# Patient Record
Sex: Male | Born: 1945 | Race: White | Hispanic: No | Marital: Married | Smoking: Current every day smoker
Health system: Southern US, Community
[De-identification: ages and names within clinical notes are randomized; demographics above are authoritative.]

## PROBLEM LIST (undated history)

## (undated) DIAGNOSIS — I10 Essential (primary) hypertension: Secondary | ICD-10-CM

## (undated) DIAGNOSIS — F419 Anxiety disorder, unspecified: Secondary | ICD-10-CM

## (undated) DIAGNOSIS — I639 Cerebral infarction, unspecified: Secondary | ICD-10-CM

## (undated) DIAGNOSIS — E039 Hypothyroidism, unspecified: Secondary | ICD-10-CM

## (undated) DIAGNOSIS — N200 Calculus of kidney: Secondary | ICD-10-CM

## (undated) DIAGNOSIS — Z720 Tobacco use: Secondary | ICD-10-CM

## (undated) DIAGNOSIS — G43909 Migraine, unspecified, not intractable, without status migrainosus: Secondary | ICD-10-CM

## (undated) DIAGNOSIS — F0390 Unspecified dementia without behavioral disturbance: Secondary | ICD-10-CM

## (undated) DIAGNOSIS — J449 Chronic obstructive pulmonary disease, unspecified: Secondary | ICD-10-CM

---
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
## 2004-05-25 ENCOUNTER — Ambulatory Visit: Payer: Self-pay | Admitting: Family Medicine

## 2014-12-16 DIAGNOSIS — R0600 Dyspnea, unspecified: Secondary | ICD-10-CM | POA: Diagnosis not present

## 2014-12-16 DIAGNOSIS — J449 Chronic obstructive pulmonary disease, unspecified: Secondary | ICD-10-CM | POA: Diagnosis not present

## 2015-01-16 DIAGNOSIS — R0600 Dyspnea, unspecified: Secondary | ICD-10-CM | POA: Diagnosis not present

## 2015-01-16 DIAGNOSIS — J449 Chronic obstructive pulmonary disease, unspecified: Secondary | ICD-10-CM | POA: Diagnosis not present

## 2015-02-16 DIAGNOSIS — J449 Chronic obstructive pulmonary disease, unspecified: Secondary | ICD-10-CM | POA: Diagnosis not present

## 2015-02-16 DIAGNOSIS — R0600 Dyspnea, unspecified: Secondary | ICD-10-CM | POA: Diagnosis not present

## 2015-02-23 DIAGNOSIS — Z0000 Encounter for general adult medical examination without abnormal findings: Secondary | ICD-10-CM | POA: Diagnosis not present

## 2015-02-23 DIAGNOSIS — Z1389 Encounter for screening for other disorder: Secondary | ICD-10-CM | POA: Diagnosis not present

## 2015-02-23 DIAGNOSIS — Z139 Encounter for screening, unspecified: Secondary | ICD-10-CM | POA: Diagnosis not present

## 2015-02-23 DIAGNOSIS — F17200 Nicotine dependence, unspecified, uncomplicated: Secondary | ICD-10-CM | POA: Diagnosis not present

## 2015-03-16 DIAGNOSIS — R32 Unspecified urinary incontinence: Secondary | ICD-10-CM | POA: Diagnosis not present

## 2015-03-16 DIAGNOSIS — N390 Urinary tract infection, site not specified: Secondary | ICD-10-CM | POA: Diagnosis not present

## 2015-03-16 DIAGNOSIS — Z125 Encounter for screening for malignant neoplasm of prostate: Secondary | ICD-10-CM | POA: Diagnosis not present

## 2015-03-16 DIAGNOSIS — E039 Hypothyroidism, unspecified: Secondary | ICD-10-CM | POA: Diagnosis not present

## 2015-03-18 DIAGNOSIS — J449 Chronic obstructive pulmonary disease, unspecified: Secondary | ICD-10-CM | POA: Diagnosis not present

## 2015-03-18 DIAGNOSIS — R0600 Dyspnea, unspecified: Secondary | ICD-10-CM | POA: Diagnosis not present

## 2015-03-27 DIAGNOSIS — I714 Abdominal aortic aneurysm, without rupture: Secondary | ICD-10-CM | POA: Diagnosis not present

## 2015-03-27 DIAGNOSIS — Z136 Encounter for screening for cardiovascular disorders: Secondary | ICD-10-CM | POA: Diagnosis not present

## 2015-03-28 DIAGNOSIS — Z23 Encounter for immunization: Secondary | ICD-10-CM | POA: Diagnosis not present

## 2015-03-28 DIAGNOSIS — Z6831 Body mass index (BMI) 31.0-31.9, adult: Secondary | ICD-10-CM | POA: Diagnosis not present

## 2015-03-28 DIAGNOSIS — R3129 Other microscopic hematuria: Secondary | ICD-10-CM | POA: Diagnosis not present

## 2015-03-28 DIAGNOSIS — E039 Hypothyroidism, unspecified: Secondary | ICD-10-CM | POA: Diagnosis not present

## 2015-04-13 DIAGNOSIS — N401 Enlarged prostate with lower urinary tract symptoms: Secondary | ICD-10-CM | POA: Diagnosis not present

## 2015-04-13 DIAGNOSIS — R3129 Other microscopic hematuria: Secondary | ICD-10-CM | POA: Diagnosis not present

## 2015-04-13 DIAGNOSIS — R351 Nocturia: Secondary | ICD-10-CM | POA: Diagnosis not present

## 2015-04-18 DIAGNOSIS — R0600 Dyspnea, unspecified: Secondary | ICD-10-CM | POA: Diagnosis not present

## 2015-04-18 DIAGNOSIS — J449 Chronic obstructive pulmonary disease, unspecified: Secondary | ICD-10-CM | POA: Diagnosis not present

## 2015-04-27 DIAGNOSIS — E782 Mixed hyperlipidemia: Secondary | ICD-10-CM | POA: Diagnosis not present

## 2015-04-27 DIAGNOSIS — E039 Hypothyroidism, unspecified: Secondary | ICD-10-CM | POA: Diagnosis not present

## 2015-04-27 DIAGNOSIS — R7301 Impaired fasting glucose: Secondary | ICD-10-CM | POA: Diagnosis not present

## 2015-05-01 DIAGNOSIS — N401 Enlarged prostate with lower urinary tract symptoms: Secondary | ICD-10-CM | POA: Diagnosis not present

## 2015-05-01 DIAGNOSIS — R3129 Other microscopic hematuria: Secondary | ICD-10-CM | POA: Diagnosis not present

## 2015-05-03 DIAGNOSIS — Z720 Tobacco use: Secondary | ICD-10-CM | POA: Diagnosis not present

## 2015-05-03 DIAGNOSIS — J019 Acute sinusitis, unspecified: Secondary | ICD-10-CM | POA: Diagnosis not present

## 2015-05-03 DIAGNOSIS — R6883 Chills (without fever): Secondary | ICD-10-CM | POA: Diagnosis not present

## 2015-05-03 DIAGNOSIS — Z6831 Body mass index (BMI) 31.0-31.9, adult: Secondary | ICD-10-CM | POA: Diagnosis not present

## 2015-05-17 DIAGNOSIS — E782 Mixed hyperlipidemia: Secondary | ICD-10-CM | POA: Diagnosis not present

## 2015-05-17 DIAGNOSIS — R7303 Prediabetes: Secondary | ICD-10-CM | POA: Diagnosis not present

## 2015-05-17 DIAGNOSIS — Z720 Tobacco use: Secondary | ICD-10-CM | POA: Diagnosis not present

## 2015-05-17 DIAGNOSIS — J449 Chronic obstructive pulmonary disease, unspecified: Secondary | ICD-10-CM | POA: Diagnosis not present

## 2015-05-18 DIAGNOSIS — R0600 Dyspnea, unspecified: Secondary | ICD-10-CM | POA: Diagnosis not present

## 2015-05-18 DIAGNOSIS — J449 Chronic obstructive pulmonary disease, unspecified: Secondary | ICD-10-CM | POA: Diagnosis not present

## 2015-05-22 DIAGNOSIS — N200 Calculus of kidney: Secondary | ICD-10-CM | POA: Diagnosis not present

## 2015-05-22 DIAGNOSIS — I714 Abdominal aortic aneurysm, without rupture: Secondary | ICD-10-CM | POA: Diagnosis not present

## 2015-05-22 DIAGNOSIS — N281 Cyst of kidney, acquired: Secondary | ICD-10-CM | POA: Diagnosis not present

## 2015-05-22 DIAGNOSIS — R310 Gross hematuria: Secondary | ICD-10-CM | POA: Diagnosis not present

## 2015-05-30 DIAGNOSIS — N200 Calculus of kidney: Secondary | ICD-10-CM | POA: Diagnosis not present

## 2015-05-30 DIAGNOSIS — N281 Cyst of kidney, acquired: Secondary | ICD-10-CM | POA: Diagnosis not present

## 2015-05-30 DIAGNOSIS — N401 Enlarged prostate with lower urinary tract symptoms: Secondary | ICD-10-CM | POA: Diagnosis not present

## 2015-05-31 DIAGNOSIS — F329 Major depressive disorder, single episode, unspecified: Secondary | ICD-10-CM | POA: Diagnosis not present

## 2015-05-31 DIAGNOSIS — Z0000 Encounter for general adult medical examination without abnormal findings: Secondary | ICD-10-CM | POA: Diagnosis not present

## 2015-05-31 DIAGNOSIS — Z139 Encounter for screening, unspecified: Secondary | ICD-10-CM | POA: Diagnosis not present

## 2015-06-01 DIAGNOSIS — Z6831 Body mass index (BMI) 31.0-31.9, adult: Secondary | ICD-10-CM | POA: Diagnosis not present

## 2015-06-01 DIAGNOSIS — J449 Chronic obstructive pulmonary disease, unspecified: Secondary | ICD-10-CM | POA: Diagnosis not present

## 2015-06-01 DIAGNOSIS — Z720 Tobacco use: Secondary | ICD-10-CM | POA: Diagnosis not present

## 2015-06-18 DIAGNOSIS — R0600 Dyspnea, unspecified: Secondary | ICD-10-CM | POA: Diagnosis not present

## 2015-06-18 DIAGNOSIS — J449 Chronic obstructive pulmonary disease, unspecified: Secondary | ICD-10-CM | POA: Diagnosis not present

## 2015-07-19 DIAGNOSIS — R0600 Dyspnea, unspecified: Secondary | ICD-10-CM | POA: Diagnosis not present

## 2015-07-19 DIAGNOSIS — J449 Chronic obstructive pulmonary disease, unspecified: Secondary | ICD-10-CM | POA: Diagnosis not present

## 2015-08-16 DIAGNOSIS — J449 Chronic obstructive pulmonary disease, unspecified: Secondary | ICD-10-CM | POA: Diagnosis not present

## 2015-08-16 DIAGNOSIS — R0600 Dyspnea, unspecified: Secondary | ICD-10-CM | POA: Diagnosis not present

## 2015-08-31 DIAGNOSIS — R7301 Impaired fasting glucose: Secondary | ICD-10-CM | POA: Diagnosis not present

## 2015-08-31 DIAGNOSIS — E782 Mixed hyperlipidemia: Secondary | ICD-10-CM | POA: Diagnosis not present

## 2015-09-13 DIAGNOSIS — R7303 Prediabetes: Secondary | ICD-10-CM | POA: Diagnosis not present

## 2015-09-13 DIAGNOSIS — J449 Chronic obstructive pulmonary disease, unspecified: Secondary | ICD-10-CM | POA: Diagnosis not present

## 2015-09-13 DIAGNOSIS — Z720 Tobacco use: Secondary | ICD-10-CM | POA: Diagnosis not present

## 2015-09-13 DIAGNOSIS — E782 Mixed hyperlipidemia: Secondary | ICD-10-CM | POA: Diagnosis not present

## 2015-09-16 DIAGNOSIS — J449 Chronic obstructive pulmonary disease, unspecified: Secondary | ICD-10-CM | POA: Diagnosis not present

## 2015-09-16 DIAGNOSIS — R0600 Dyspnea, unspecified: Secondary | ICD-10-CM | POA: Diagnosis not present

## 2015-10-16 DIAGNOSIS — J449 Chronic obstructive pulmonary disease, unspecified: Secondary | ICD-10-CM | POA: Diagnosis not present

## 2015-10-16 DIAGNOSIS — R0600 Dyspnea, unspecified: Secondary | ICD-10-CM | POA: Diagnosis not present

## 2015-10-18 DIAGNOSIS — Z6831 Body mass index (BMI) 31.0-31.9, adult: Secondary | ICD-10-CM | POA: Diagnosis not present

## 2015-10-18 DIAGNOSIS — J449 Chronic obstructive pulmonary disease, unspecified: Secondary | ICD-10-CM | POA: Diagnosis not present

## 2015-10-18 DIAGNOSIS — Z720 Tobacco use: Secondary | ICD-10-CM | POA: Diagnosis not present

## 2015-11-16 DIAGNOSIS — R0600 Dyspnea, unspecified: Secondary | ICD-10-CM | POA: Diagnosis not present

## 2015-11-16 DIAGNOSIS — J449 Chronic obstructive pulmonary disease, unspecified: Secondary | ICD-10-CM | POA: Diagnosis not present

## 2015-12-16 DIAGNOSIS — R0600 Dyspnea, unspecified: Secondary | ICD-10-CM | POA: Diagnosis not present

## 2015-12-16 DIAGNOSIS — J449 Chronic obstructive pulmonary disease, unspecified: Secondary | ICD-10-CM | POA: Diagnosis not present

## 2015-12-28 DIAGNOSIS — E782 Mixed hyperlipidemia: Secondary | ICD-10-CM | POA: Diagnosis not present

## 2015-12-28 DIAGNOSIS — R7303 Prediabetes: Secondary | ICD-10-CM | POA: Diagnosis not present

## 2015-12-28 DIAGNOSIS — F17200 Nicotine dependence, unspecified, uncomplicated: Secondary | ICD-10-CM | POA: Diagnosis not present

## 2015-12-28 DIAGNOSIS — E039 Hypothyroidism, unspecified: Secondary | ICD-10-CM | POA: Diagnosis not present

## 2015-12-28 DIAGNOSIS — Z1389 Encounter for screening for other disorder: Secondary | ICD-10-CM | POA: Diagnosis not present

## 2016-01-16 DIAGNOSIS — R0600 Dyspnea, unspecified: Secondary | ICD-10-CM | POA: Diagnosis not present

## 2016-01-16 DIAGNOSIS — J449 Chronic obstructive pulmonary disease, unspecified: Secondary | ICD-10-CM | POA: Diagnosis not present

## 2016-02-16 DIAGNOSIS — J449 Chronic obstructive pulmonary disease, unspecified: Secondary | ICD-10-CM | POA: Diagnosis not present

## 2016-02-16 DIAGNOSIS — R0600 Dyspnea, unspecified: Secondary | ICD-10-CM | POA: Diagnosis not present

## 2016-03-06 DIAGNOSIS — Z0000 Encounter for general adult medical examination without abnormal findings: Secondary | ICD-10-CM | POA: Diagnosis not present

## 2016-03-06 DIAGNOSIS — Z139 Encounter for screening, unspecified: Secondary | ICD-10-CM | POA: Diagnosis not present

## 2016-03-06 DIAGNOSIS — Z1389 Encounter for screening for other disorder: Secondary | ICD-10-CM | POA: Diagnosis not present

## 2016-03-12 DIAGNOSIS — J449 Chronic obstructive pulmonary disease, unspecified: Secondary | ICD-10-CM | POA: Diagnosis not present

## 2016-03-12 DIAGNOSIS — J0100 Acute maxillary sinusitis, unspecified: Secondary | ICD-10-CM | POA: Diagnosis not present

## 2016-03-17 DIAGNOSIS — J449 Chronic obstructive pulmonary disease, unspecified: Secondary | ICD-10-CM | POA: Diagnosis not present

## 2016-03-17 DIAGNOSIS — R0600 Dyspnea, unspecified: Secondary | ICD-10-CM | POA: Diagnosis not present

## 2016-04-17 DIAGNOSIS — J449 Chronic obstructive pulmonary disease, unspecified: Secondary | ICD-10-CM | POA: Diagnosis not present

## 2016-04-17 DIAGNOSIS — R0600 Dyspnea, unspecified: Secondary | ICD-10-CM | POA: Diagnosis not present

## 2016-05-17 DIAGNOSIS — R0600 Dyspnea, unspecified: Secondary | ICD-10-CM | POA: Diagnosis not present

## 2016-05-17 DIAGNOSIS — J449 Chronic obstructive pulmonary disease, unspecified: Secondary | ICD-10-CM | POA: Diagnosis not present

## 2016-06-17 DIAGNOSIS — J449 Chronic obstructive pulmonary disease, unspecified: Secondary | ICD-10-CM | POA: Diagnosis not present

## 2016-06-17 DIAGNOSIS — R0600 Dyspnea, unspecified: Secondary | ICD-10-CM | POA: Diagnosis not present

## 2016-07-18 DIAGNOSIS — J449 Chronic obstructive pulmonary disease, unspecified: Secondary | ICD-10-CM | POA: Diagnosis not present

## 2016-07-18 DIAGNOSIS — R0600 Dyspnea, unspecified: Secondary | ICD-10-CM | POA: Diagnosis not present

## 2016-07-20 DIAGNOSIS — J069 Acute upper respiratory infection, unspecified: Secondary | ICD-10-CM | POA: Diagnosis not present

## 2016-07-20 DIAGNOSIS — R05 Cough: Secondary | ICD-10-CM | POA: Diagnosis not present

## 2016-08-15 DIAGNOSIS — Z720 Tobacco use: Secondary | ICD-10-CM | POA: Diagnosis not present

## 2016-08-15 DIAGNOSIS — R319 Hematuria, unspecified: Secondary | ICD-10-CM | POA: Diagnosis not present

## 2016-08-15 DIAGNOSIS — E782 Mixed hyperlipidemia: Secondary | ICD-10-CM | POA: Diagnosis not present

## 2016-08-15 DIAGNOSIS — R0600 Dyspnea, unspecified: Secondary | ICD-10-CM | POA: Diagnosis not present

## 2016-08-15 DIAGNOSIS — D126 Benign neoplasm of colon, unspecified: Secondary | ICD-10-CM | POA: Diagnosis not present

## 2016-08-15 DIAGNOSIS — E039 Hypothyroidism, unspecified: Secondary | ICD-10-CM | POA: Diagnosis not present

## 2016-08-15 DIAGNOSIS — J449 Chronic obstructive pulmonary disease, unspecified: Secondary | ICD-10-CM | POA: Diagnosis not present

## 2016-08-22 DIAGNOSIS — R319 Hematuria, unspecified: Secondary | ICD-10-CM | POA: Diagnosis not present

## 2016-08-22 DIAGNOSIS — Z87891 Personal history of nicotine dependence: Secondary | ICD-10-CM | POA: Diagnosis not present

## 2016-08-22 DIAGNOSIS — Z6829 Body mass index (BMI) 29.0-29.9, adult: Secondary | ICD-10-CM | POA: Diagnosis not present

## 2016-08-22 DIAGNOSIS — J449 Chronic obstructive pulmonary disease, unspecified: Secondary | ICD-10-CM | POA: Diagnosis not present

## 2016-08-28 DIAGNOSIS — F330 Major depressive disorder, recurrent, mild: Secondary | ICD-10-CM | POA: Diagnosis not present

## 2016-08-28 DIAGNOSIS — E039 Hypothyroidism, unspecified: Secondary | ICD-10-CM | POA: Diagnosis not present

## 2016-08-28 DIAGNOSIS — N400 Enlarged prostate without lower urinary tract symptoms: Secondary | ICD-10-CM | POA: Diagnosis not present

## 2016-08-28 DIAGNOSIS — E663 Overweight: Secondary | ICD-10-CM | POA: Diagnosis not present

## 2016-08-28 DIAGNOSIS — F17200 Nicotine dependence, unspecified, uncomplicated: Secondary | ICD-10-CM | POA: Diagnosis not present

## 2016-08-28 DIAGNOSIS — E785 Hyperlipidemia, unspecified: Secondary | ICD-10-CM | POA: Diagnosis not present

## 2016-08-28 DIAGNOSIS — N189 Chronic kidney disease, unspecified: Secondary | ICD-10-CM | POA: Diagnosis not present

## 2016-08-28 DIAGNOSIS — J449 Chronic obstructive pulmonary disease, unspecified: Secondary | ICD-10-CM | POA: Diagnosis not present

## 2016-08-28 DIAGNOSIS — K219 Gastro-esophageal reflux disease without esophagitis: Secondary | ICD-10-CM | POA: Diagnosis not present

## 2016-09-15 DIAGNOSIS — J449 Chronic obstructive pulmonary disease, unspecified: Secondary | ICD-10-CM | POA: Diagnosis not present

## 2016-09-15 DIAGNOSIS — R0600 Dyspnea, unspecified: Secondary | ICD-10-CM | POA: Diagnosis not present

## 2016-09-18 DIAGNOSIS — N401 Enlarged prostate with lower urinary tract symptoms: Secondary | ICD-10-CM | POA: Diagnosis not present

## 2016-09-18 DIAGNOSIS — N281 Cyst of kidney, acquired: Secondary | ICD-10-CM | POA: Diagnosis not present

## 2016-09-18 DIAGNOSIS — N200 Calculus of kidney: Secondary | ICD-10-CM | POA: Diagnosis not present

## 2016-09-18 DIAGNOSIS — R351 Nocturia: Secondary | ICD-10-CM | POA: Diagnosis not present

## 2016-10-02 DIAGNOSIS — N200 Calculus of kidney: Secondary | ICD-10-CM | POA: Diagnosis not present

## 2016-10-08 DIAGNOSIS — Z1211 Encounter for screening for malignant neoplasm of colon: Secondary | ICD-10-CM | POA: Diagnosis not present

## 2016-10-15 DIAGNOSIS — J449 Chronic obstructive pulmonary disease, unspecified: Secondary | ICD-10-CM | POA: Diagnosis not present

## 2016-10-15 DIAGNOSIS — R0600 Dyspnea, unspecified: Secondary | ICD-10-CM | POA: Diagnosis not present

## 2016-10-16 DIAGNOSIS — N401 Enlarged prostate with lower urinary tract symptoms: Secondary | ICD-10-CM | POA: Diagnosis not present

## 2016-10-16 DIAGNOSIS — N200 Calculus of kidney: Secondary | ICD-10-CM | POA: Diagnosis not present

## 2016-10-16 DIAGNOSIS — Z125 Encounter for screening for malignant neoplasm of prostate: Secondary | ICD-10-CM | POA: Diagnosis not present

## 2016-10-18 DIAGNOSIS — F17210 Nicotine dependence, cigarettes, uncomplicated: Secondary | ICD-10-CM | POA: Diagnosis not present

## 2016-10-18 DIAGNOSIS — J449 Chronic obstructive pulmonary disease, unspecified: Secondary | ICD-10-CM | POA: Diagnosis not present

## 2016-10-18 DIAGNOSIS — N400 Enlarged prostate without lower urinary tract symptoms: Secondary | ICD-10-CM | POA: Diagnosis not present

## 2016-10-18 DIAGNOSIS — N200 Calculus of kidney: Secondary | ICD-10-CM | POA: Diagnosis not present

## 2016-10-18 DIAGNOSIS — F329 Major depressive disorder, single episode, unspecified: Secondary | ICD-10-CM | POA: Diagnosis not present

## 2016-10-18 DIAGNOSIS — Z79899 Other long term (current) drug therapy: Secondary | ICD-10-CM | POA: Diagnosis not present

## 2016-10-25 DIAGNOSIS — N200 Calculus of kidney: Secondary | ICD-10-CM | POA: Diagnosis not present

## 2016-10-28 DIAGNOSIS — N200 Calculus of kidney: Secondary | ICD-10-CM | POA: Diagnosis not present

## 2016-10-28 DIAGNOSIS — R351 Nocturia: Secondary | ICD-10-CM | POA: Diagnosis not present

## 2016-10-28 DIAGNOSIS — N401 Enlarged prostate with lower urinary tract symptoms: Secondary | ICD-10-CM | POA: Diagnosis not present

## 2016-10-31 DIAGNOSIS — N201 Calculus of ureter: Secondary | ICD-10-CM | POA: Diagnosis not present

## 2016-10-31 DIAGNOSIS — R109 Unspecified abdominal pain: Secondary | ICD-10-CM | POA: Diagnosis not present

## 2016-11-11 DIAGNOSIS — J449 Chronic obstructive pulmonary disease, unspecified: Secondary | ICD-10-CM | POA: Diagnosis not present

## 2016-11-11 DIAGNOSIS — G4700 Insomnia, unspecified: Secondary | ICD-10-CM | POA: Diagnosis not present

## 2016-11-11 DIAGNOSIS — K219 Gastro-esophageal reflux disease without esophagitis: Secondary | ICD-10-CM | POA: Diagnosis not present

## 2016-11-11 DIAGNOSIS — F419 Anxiety disorder, unspecified: Secondary | ICD-10-CM | POA: Diagnosis not present

## 2016-11-11 DIAGNOSIS — K5730 Diverticulosis of large intestine without perforation or abscess without bleeding: Secondary | ICD-10-CM | POA: Diagnosis not present

## 2016-11-11 DIAGNOSIS — F329 Major depressive disorder, single episode, unspecified: Secondary | ICD-10-CM | POA: Diagnosis not present

## 2016-11-11 DIAGNOSIS — Z1211 Encounter for screening for malignant neoplasm of colon: Secondary | ICD-10-CM | POA: Diagnosis not present

## 2016-11-11 DIAGNOSIS — K648 Other hemorrhoids: Secondary | ICD-10-CM | POA: Diagnosis not present

## 2016-11-11 DIAGNOSIS — E7800 Pure hypercholesterolemia, unspecified: Secondary | ICD-10-CM | POA: Diagnosis not present

## 2016-11-15 DIAGNOSIS — R0600 Dyspnea, unspecified: Secondary | ICD-10-CM | POA: Diagnosis not present

## 2016-11-15 DIAGNOSIS — J449 Chronic obstructive pulmonary disease, unspecified: Secondary | ICD-10-CM | POA: Diagnosis not present

## 2016-11-16 DIAGNOSIS — R0781 Pleurodynia: Secondary | ICD-10-CM | POA: Diagnosis not present

## 2016-11-16 DIAGNOSIS — S20212A Contusion of left front wall of thorax, initial encounter: Secondary | ICD-10-CM | POA: Diagnosis not present

## 2016-11-16 DIAGNOSIS — S299XXA Unspecified injury of thorax, initial encounter: Secondary | ICD-10-CM | POA: Diagnosis not present

## 2016-11-22 DIAGNOSIS — R7303 Prediabetes: Secondary | ICD-10-CM | POA: Diagnosis not present

## 2016-11-22 DIAGNOSIS — E782 Mixed hyperlipidemia: Secondary | ICD-10-CM | POA: Diagnosis not present

## 2016-11-22 DIAGNOSIS — E039 Hypothyroidism, unspecified: Secondary | ICD-10-CM | POA: Diagnosis not present

## 2016-11-27 DIAGNOSIS — E782 Mixed hyperlipidemia: Secondary | ICD-10-CM | POA: Diagnosis not present

## 2016-11-27 DIAGNOSIS — R42 Dizziness and giddiness: Secondary | ICD-10-CM | POA: Diagnosis not present

## 2016-11-27 DIAGNOSIS — Z139 Encounter for screening, unspecified: Secondary | ICD-10-CM | POA: Diagnosis not present

## 2016-11-27 DIAGNOSIS — K219 Gastro-esophageal reflux disease without esophagitis: Secondary | ICD-10-CM | POA: Diagnosis not present

## 2016-11-27 DIAGNOSIS — R109 Unspecified abdominal pain: Secondary | ICD-10-CM | POA: Diagnosis not present

## 2016-11-27 DIAGNOSIS — Z6829 Body mass index (BMI) 29.0-29.9, adult: Secondary | ICD-10-CM | POA: Diagnosis not present

## 2016-11-27 DIAGNOSIS — F312 Bipolar disorder, current episode manic severe with psychotic features: Secondary | ICD-10-CM | POA: Diagnosis not present

## 2016-11-27 DIAGNOSIS — N401 Enlarged prostate with lower urinary tract symptoms: Secondary | ICD-10-CM | POA: Diagnosis not present

## 2016-11-27 DIAGNOSIS — E039 Hypothyroidism, unspecified: Secondary | ICD-10-CM | POA: Diagnosis not present

## 2016-11-27 DIAGNOSIS — E785 Hyperlipidemia, unspecified: Secondary | ICD-10-CM | POA: Diagnosis not present

## 2016-12-15 DIAGNOSIS — J449 Chronic obstructive pulmonary disease, unspecified: Secondary | ICD-10-CM | POA: Diagnosis not present

## 2016-12-15 DIAGNOSIS — R0600 Dyspnea, unspecified: Secondary | ICD-10-CM | POA: Diagnosis not present

## 2017-01-15 DIAGNOSIS — J449 Chronic obstructive pulmonary disease, unspecified: Secondary | ICD-10-CM | POA: Diagnosis not present

## 2017-01-15 DIAGNOSIS — R0600 Dyspnea, unspecified: Secondary | ICD-10-CM | POA: Diagnosis not present

## 2017-02-05 DIAGNOSIS — R351 Nocturia: Secondary | ICD-10-CM | POA: Diagnosis not present

## 2017-02-05 DIAGNOSIS — N401 Enlarged prostate with lower urinary tract symptoms: Secondary | ICD-10-CM | POA: Diagnosis not present

## 2017-02-05 DIAGNOSIS — N200 Calculus of kidney: Secondary | ICD-10-CM | POA: Diagnosis not present

## 2017-02-15 DIAGNOSIS — R0600 Dyspnea, unspecified: Secondary | ICD-10-CM | POA: Diagnosis not present

## 2017-02-15 DIAGNOSIS — J449 Chronic obstructive pulmonary disease, unspecified: Secondary | ICD-10-CM | POA: Diagnosis not present

## 2017-02-27 DIAGNOSIS — E039 Hypothyroidism, unspecified: Secondary | ICD-10-CM | POA: Diagnosis not present

## 2017-02-27 DIAGNOSIS — E782 Mixed hyperlipidemia: Secondary | ICD-10-CM | POA: Diagnosis not present

## 2017-02-27 DIAGNOSIS — R7303 Prediabetes: Secondary | ICD-10-CM | POA: Diagnosis not present

## 2017-03-08 DIAGNOSIS — S32019A Unspecified fracture of first lumbar vertebra, initial encounter for closed fracture: Secondary | ICD-10-CM | POA: Diagnosis not present

## 2017-03-08 DIAGNOSIS — I719 Aortic aneurysm of unspecified site, without rupture: Secondary | ICD-10-CM | POA: Diagnosis not present

## 2017-03-08 DIAGNOSIS — S3992XA Unspecified injury of lower back, initial encounter: Secondary | ICD-10-CM | POA: Diagnosis not present

## 2017-03-08 DIAGNOSIS — M549 Dorsalgia, unspecified: Secondary | ICD-10-CM | POA: Diagnosis not present

## 2017-03-08 DIAGNOSIS — S32010A Wedge compression fracture of first lumbar vertebra, initial encounter for closed fracture: Secondary | ICD-10-CM | POA: Diagnosis not present

## 2017-03-11 DIAGNOSIS — S32000A Wedge compression fracture of unspecified lumbar vertebra, initial encounter for closed fracture: Secondary | ICD-10-CM | POA: Diagnosis not present

## 2017-03-13 DIAGNOSIS — S32010A Wedge compression fracture of first lumbar vertebra, initial encounter for closed fracture: Secondary | ICD-10-CM | POA: Diagnosis not present

## 2017-03-13 DIAGNOSIS — M48061 Spinal stenosis, lumbar region without neurogenic claudication: Secondary | ICD-10-CM | POA: Diagnosis not present

## 2017-03-13 DIAGNOSIS — W19XXXA Unspecified fall, initial encounter: Secondary | ICD-10-CM | POA: Diagnosis not present

## 2017-03-14 DIAGNOSIS — S32000A Wedge compression fracture of unspecified lumbar vertebra, initial encounter for closed fracture: Secondary | ICD-10-CM | POA: Diagnosis not present

## 2017-03-17 DIAGNOSIS — R0600 Dyspnea, unspecified: Secondary | ICD-10-CM | POA: Diagnosis not present

## 2017-03-17 DIAGNOSIS — J449 Chronic obstructive pulmonary disease, unspecified: Secondary | ICD-10-CM | POA: Diagnosis not present

## 2017-03-20 DIAGNOSIS — S32010A Wedge compression fracture of first lumbar vertebra, initial encounter for closed fracture: Secondary | ICD-10-CM | POA: Diagnosis not present

## 2017-03-20 DIAGNOSIS — S32030A Wedge compression fracture of third lumbar vertebra, initial encounter for closed fracture: Secondary | ICD-10-CM | POA: Diagnosis not present

## 2017-03-20 DIAGNOSIS — J449 Chronic obstructive pulmonary disease, unspecified: Secondary | ICD-10-CM | POA: Diagnosis not present

## 2017-03-20 DIAGNOSIS — Z79899 Other long term (current) drug therapy: Secondary | ICD-10-CM | POA: Diagnosis not present

## 2017-03-20 DIAGNOSIS — N401 Enlarged prostate with lower urinary tract symptoms: Secondary | ICD-10-CM | POA: Diagnosis not present

## 2017-03-20 DIAGNOSIS — E785 Hyperlipidemia, unspecified: Secondary | ICD-10-CM | POA: Diagnosis not present

## 2017-03-20 DIAGNOSIS — R351 Nocturia: Secondary | ICD-10-CM | POA: Diagnosis not present

## 2017-03-20 DIAGNOSIS — F17210 Nicotine dependence, cigarettes, uncomplicated: Secondary | ICD-10-CM | POA: Diagnosis not present

## 2017-03-20 DIAGNOSIS — Z87891 Personal history of nicotine dependence: Secondary | ICD-10-CM | POA: Diagnosis not present

## 2017-04-17 DIAGNOSIS — R0600 Dyspnea, unspecified: Secondary | ICD-10-CM | POA: Diagnosis not present

## 2017-04-17 DIAGNOSIS — J449 Chronic obstructive pulmonary disease, unspecified: Secondary | ICD-10-CM | POA: Diagnosis not present

## 2017-05-06 ENCOUNTER — Other Ambulatory Visit: Payer: Self-pay

## 2017-05-06 NOTE — Patient Outreach (Signed)
[facility]) Care Management  05/06/2017  Champ MungoMelvin C Bunner 10/05/1945 [id number]    1stTelephone call to patient for High risk list .  No answer. Unable to leave message.   Plan: RN Health Coach will make outreach attempt to patient within three business days.  Juanell Fairlyraci Maxamillion Banas RN, BSN, [facility] RN Health Coach Disease Management [facility] Direct Dial:  [phone] Fax: [phone]

## 2017-05-07 ENCOUNTER — Other Ambulatory Visit: Payer: Self-pay

## 2017-05-07 NOTE — Patient Outreach (Signed)
[facility]) Care Management  05/07/2017  Jordan Sawyer 02/22/1946 [id number]  2nd Telephone call to patient for High Risk member list.  Wife answered the phone and stated that the patient was asleep.  HIPAA complaint voice message left.    Plan: RN Health Coach will make outreach attempt to patient within the month of November.  Juanell Fairlyraci Quana Chamberlain RN, BSN, [facility] RN Health Coach Disease Management [facility] Direct Dial:  [phone] Fax: [phone]    '

## 2017-05-14 DIAGNOSIS — M545 Low back pain: Secondary | ICD-10-CM | POA: Diagnosis not present

## 2017-05-16 ENCOUNTER — Other Ambulatory Visit: Payer: Self-pay

## 2017-05-16 NOTE — Patient Outreach (Signed)
[facility]) Care Management  05/16/2017  Champ MungoMelvin C Huisman 01/16/1946 [id number]   3rd Telephone call to patient for High risk member list. Both numbers listed called. No answer.   Plan: RN Health Coach will send letter to attempt outreach.  If no response within ten business days will proceed with case closure.     Juanell Fairlyraci Devaughn Savant RN, BSN, [facility] RN Health Coach Disease Management [facility] Direct Dial:  [phone] Fax: [phone]

## 2017-05-17 DIAGNOSIS — R0600 Dyspnea, unspecified: Secondary | ICD-10-CM | POA: Diagnosis not present

## 2017-05-17 DIAGNOSIS — J449 Chronic obstructive pulmonary disease, unspecified: Secondary | ICD-10-CM | POA: Diagnosis not present

## 2017-06-17 DIAGNOSIS — J449 Chronic obstructive pulmonary disease, unspecified: Secondary | ICD-10-CM | POA: Diagnosis not present

## 2017-06-17 DIAGNOSIS — R0600 Dyspnea, unspecified: Secondary | ICD-10-CM | POA: Diagnosis not present

## 2017-07-18 DIAGNOSIS — J449 Chronic obstructive pulmonary disease, unspecified: Secondary | ICD-10-CM | POA: Diagnosis not present

## 2017-07-18 DIAGNOSIS — R0600 Dyspnea, unspecified: Secondary | ICD-10-CM | POA: Diagnosis not present

## 2017-07-22 DIAGNOSIS — M25552 Pain in left hip: Secondary | ICD-10-CM | POA: Diagnosis not present

## 2017-07-22 DIAGNOSIS — M545 Low back pain: Secondary | ICD-10-CM | POA: Diagnosis not present

## 2017-07-25 DIAGNOSIS — M1612 Unilateral primary osteoarthritis, left hip: Secondary | ICD-10-CM | POA: Diagnosis not present

## 2017-08-11 DIAGNOSIS — N200 Calculus of kidney: Secondary | ICD-10-CM | POA: Diagnosis not present

## 2017-08-11 DIAGNOSIS — N401 Enlarged prostate with lower urinary tract symptoms: Secondary | ICD-10-CM | POA: Diagnosis not present

## 2017-08-12 DIAGNOSIS — M545 Low back pain: Secondary | ICD-10-CM | POA: Diagnosis not present

## 2017-08-12 DIAGNOSIS — M1612 Unilateral primary osteoarthritis, left hip: Secondary | ICD-10-CM | POA: Diagnosis not present

## 2017-08-15 DIAGNOSIS — R0600 Dyspnea, unspecified: Secondary | ICD-10-CM | POA: Diagnosis not present

## 2017-08-15 DIAGNOSIS — J449 Chronic obstructive pulmonary disease, unspecified: Secondary | ICD-10-CM | POA: Diagnosis not present

## 2017-08-20 DIAGNOSIS — F17200 Nicotine dependence, unspecified, uncomplicated: Secondary | ICD-10-CM | POA: Diagnosis not present

## 2017-08-20 DIAGNOSIS — M9983 Other biomechanical lesions of lumbar region: Secondary | ICD-10-CM | POA: Diagnosis not present

## 2017-08-20 DIAGNOSIS — M5416 Radiculopathy, lumbar region: Secondary | ICD-10-CM | POA: Diagnosis not present

## 2017-08-20 DIAGNOSIS — J449 Chronic obstructive pulmonary disease, unspecified: Secondary | ICD-10-CM | POA: Diagnosis not present

## 2017-08-20 DIAGNOSIS — G894 Chronic pain syndrome: Secondary | ICD-10-CM | POA: Diagnosis not present

## 2017-08-20 DIAGNOSIS — F329 Major depressive disorder, single episode, unspecified: Secondary | ICD-10-CM | POA: Diagnosis not present

## 2017-08-20 DIAGNOSIS — M47816 Spondylosis without myelopathy or radiculopathy, lumbar region: Secondary | ICD-10-CM | POA: Diagnosis not present

## 2017-08-20 DIAGNOSIS — Z720 Tobacco use: Secondary | ICD-10-CM | POA: Diagnosis not present

## 2017-09-15 DIAGNOSIS — J449 Chronic obstructive pulmonary disease, unspecified: Secondary | ICD-10-CM | POA: Diagnosis not present

## 2017-09-15 DIAGNOSIS — R0600 Dyspnea, unspecified: Secondary | ICD-10-CM | POA: Diagnosis not present

## 2017-09-26 DIAGNOSIS — F17200 Nicotine dependence, unspecified, uncomplicated: Secondary | ICD-10-CM | POA: Diagnosis not present

## 2017-09-26 DIAGNOSIS — J449 Chronic obstructive pulmonary disease, unspecified: Secondary | ICD-10-CM | POA: Diagnosis not present

## 2017-09-26 DIAGNOSIS — R7301 Impaired fasting glucose: Secondary | ICD-10-CM | POA: Diagnosis not present

## 2017-09-26 DIAGNOSIS — E782 Mixed hyperlipidemia: Secondary | ICD-10-CM | POA: Diagnosis not present

## 2017-09-26 DIAGNOSIS — E039 Hypothyroidism, unspecified: Secondary | ICD-10-CM | POA: Diagnosis not present

## 2017-09-26 DIAGNOSIS — Z139 Encounter for screening, unspecified: Secondary | ICD-10-CM | POA: Diagnosis not present

## 2017-09-26 DIAGNOSIS — Z1331 Encounter for screening for depression: Secondary | ICD-10-CM | POA: Diagnosis not present

## 2017-09-26 DIAGNOSIS — R030 Elevated blood-pressure reading, without diagnosis of hypertension: Secondary | ICD-10-CM | POA: Diagnosis not present

## 2017-09-26 DIAGNOSIS — Z0000 Encounter for general adult medical examination without abnormal findings: Secondary | ICD-10-CM | POA: Diagnosis not present

## 2017-10-15 DIAGNOSIS — R0600 Dyspnea, unspecified: Secondary | ICD-10-CM | POA: Diagnosis not present

## 2017-10-15 DIAGNOSIS — J449 Chronic obstructive pulmonary disease, unspecified: Secondary | ICD-10-CM | POA: Diagnosis not present

## 2017-10-31 ENCOUNTER — Encounter: Payer: Self-pay | Admitting: Gastroenterology

## 2017-11-15 DIAGNOSIS — J449 Chronic obstructive pulmonary disease, unspecified: Secondary | ICD-10-CM | POA: Diagnosis not present

## 2017-11-15 DIAGNOSIS — R0600 Dyspnea, unspecified: Secondary | ICD-10-CM | POA: Diagnosis not present

## 2017-12-15 DIAGNOSIS — J449 Chronic obstructive pulmonary disease, unspecified: Secondary | ICD-10-CM | POA: Diagnosis not present

## 2017-12-15 DIAGNOSIS — R0600 Dyspnea, unspecified: Secondary | ICD-10-CM | POA: Diagnosis not present

## 2018-01-15 DIAGNOSIS — R0600 Dyspnea, unspecified: Secondary | ICD-10-CM | POA: Diagnosis not present

## 2018-01-15 DIAGNOSIS — J449 Chronic obstructive pulmonary disease, unspecified: Secondary | ICD-10-CM | POA: Diagnosis not present

## 2018-03-06 DIAGNOSIS — Z6828 Body mass index (BMI) 28.0-28.9, adult: Secondary | ICD-10-CM | POA: Diagnosis not present

## 2018-03-06 DIAGNOSIS — E663 Overweight: Secondary | ICD-10-CM | POA: Diagnosis not present

## 2018-03-06 DIAGNOSIS — E785 Hyperlipidemia, unspecified: Secondary | ICD-10-CM | POA: Diagnosis not present

## 2018-03-06 DIAGNOSIS — F3340 Major depressive disorder, recurrent, in remission, unspecified: Secondary | ICD-10-CM | POA: Diagnosis not present

## 2018-03-06 DIAGNOSIS — K219 Gastro-esophageal reflux disease without esophagitis: Secondary | ICD-10-CM | POA: Diagnosis not present

## 2018-03-06 DIAGNOSIS — N401 Enlarged prostate with lower urinary tract symptoms: Secondary | ICD-10-CM | POA: Diagnosis not present

## 2018-03-06 DIAGNOSIS — M47819 Spondylosis without myelopathy or radiculopathy, site unspecified: Secondary | ICD-10-CM | POA: Diagnosis not present

## 2018-03-06 DIAGNOSIS — F17210 Nicotine dependence, cigarettes, uncomplicated: Secondary | ICD-10-CM | POA: Diagnosis not present

## 2018-03-06 DIAGNOSIS — R2681 Unsteadiness on feet: Secondary | ICD-10-CM | POA: Diagnosis not present

## 2018-04-17 DIAGNOSIS — E119 Type 2 diabetes mellitus without complications: Secondary | ICD-10-CM | POA: Diagnosis not present

## 2018-04-17 DIAGNOSIS — I10 Essential (primary) hypertension: Secondary | ICD-10-CM | POA: Diagnosis not present

## 2018-04-17 DIAGNOSIS — Z23 Encounter for immunization: Secondary | ICD-10-CM | POA: Diagnosis not present

## 2018-04-17 DIAGNOSIS — E038 Other specified hypothyroidism: Secondary | ICD-10-CM | POA: Diagnosis not present

## 2018-04-17 DIAGNOSIS — D518 Other vitamin B12 deficiency anemias: Secondary | ICD-10-CM | POA: Diagnosis not present

## 2018-04-17 DIAGNOSIS — N400 Enlarged prostate without lower urinary tract symptoms: Secondary | ICD-10-CM | POA: Diagnosis not present

## 2018-04-17 DIAGNOSIS — E559 Vitamin D deficiency, unspecified: Secondary | ICD-10-CM | POA: Diagnosis not present

## 2018-04-17 DIAGNOSIS — E782 Mixed hyperlipidemia: Secondary | ICD-10-CM | POA: Diagnosis not present

## 2018-04-22 DIAGNOSIS — S0990XA Unspecified injury of head, initial encounter: Secondary | ICD-10-CM | POA: Diagnosis not present

## 2018-04-22 DIAGNOSIS — Z23 Encounter for immunization: Secondary | ICD-10-CM | POA: Diagnosis not present

## 2018-04-22 DIAGNOSIS — S199XXA Unspecified injury of neck, initial encounter: Secondary | ICD-10-CM | POA: Diagnosis not present

## 2018-04-22 DIAGNOSIS — S0993XA Unspecified injury of face, initial encounter: Secondary | ICD-10-CM | POA: Diagnosis not present

## 2018-04-22 DIAGNOSIS — S069X9A Unspecified intracranial injury with loss of consciousness of unspecified duration, initial encounter: Secondary | ICD-10-CM | POA: Diagnosis not present

## 2018-04-22 DIAGNOSIS — S0121XA Laceration without foreign body of nose, initial encounter: Secondary | ICD-10-CM | POA: Diagnosis not present

## 2018-05-12 DIAGNOSIS — D518 Other vitamin B12 deficiency anemias: Secondary | ICD-10-CM | POA: Diagnosis not present

## 2018-05-12 DIAGNOSIS — E782 Mixed hyperlipidemia: Secondary | ICD-10-CM | POA: Diagnosis not present

## 2018-05-12 DIAGNOSIS — N400 Enlarged prostate without lower urinary tract symptoms: Secondary | ICD-10-CM | POA: Diagnosis not present

## 2018-05-12 DIAGNOSIS — I10 Essential (primary) hypertension: Secondary | ICD-10-CM | POA: Diagnosis not present

## 2018-05-12 DIAGNOSIS — M5442 Lumbago with sciatica, left side: Secondary | ICD-10-CM | POA: Diagnosis not present

## 2018-07-14 DIAGNOSIS — E038 Other specified hypothyroidism: Secondary | ICD-10-CM | POA: Diagnosis not present

## 2018-07-14 DIAGNOSIS — I10 Essential (primary) hypertension: Secondary | ICD-10-CM | POA: Diagnosis not present

## 2018-07-14 DIAGNOSIS — E782 Mixed hyperlipidemia: Secondary | ICD-10-CM | POA: Diagnosis not present

## 2018-07-14 DIAGNOSIS — E559 Vitamin D deficiency, unspecified: Secondary | ICD-10-CM | POA: Diagnosis not present

## 2018-07-14 DIAGNOSIS — D518 Other vitamin B12 deficiency anemias: Secondary | ICD-10-CM | POA: Diagnosis not present

## 2018-07-14 DIAGNOSIS — E119 Type 2 diabetes mellitus without complications: Secondary | ICD-10-CM | POA: Diagnosis not present

## 2018-12-15 ENCOUNTER — Inpatient Hospital Stay (HOSPITAL_COMMUNITY)
Admission: AD | Admit: 2018-12-15 | Discharge: 2018-12-18 | DRG: 064 | Disposition: A | Discharge status: Home Health | Payer: Medicare PPO | Source: Other Acute Inpatient Hospital | Attending: Neurology | Admitting: Neurology

## 2018-12-15 ENCOUNTER — Encounter (HOSPITAL_COMMUNITY): Payer: Self-pay | Admitting: Neurology

## 2018-12-15 DIAGNOSIS — R451 Restlessness and agitation: Secondary | ICD-10-CM | POA: Diagnosis not present

## 2018-12-15 DIAGNOSIS — Z9282 Status post administration of tPA (rtPA) in a different facility within the last 24 hours prior to admission to current facility: Secondary | ICD-10-CM | POA: Diagnosis not present

## 2018-12-15 DIAGNOSIS — I618 Other nontraumatic intracerebral hemorrhage: Secondary | ICD-10-CM | POA: Diagnosis present

## 2018-12-15 DIAGNOSIS — J449 Chronic obstructive pulmonary disease, unspecified: Secondary | ICD-10-CM | POA: Diagnosis present

## 2018-12-15 DIAGNOSIS — I63311 Cerebral infarction due to thrombosis of right middle cerebral artery: Secondary | ICD-10-CM | POA: Diagnosis not present

## 2018-12-15 DIAGNOSIS — I674 Hypertensive encephalopathy: Secondary | ICD-10-CM | POA: Diagnosis present

## 2018-12-15 DIAGNOSIS — I619 Nontraumatic intracerebral hemorrhage, unspecified: Secondary | ICD-10-CM | POA: Diagnosis not present

## 2018-12-15 DIAGNOSIS — F17200 Nicotine dependence, unspecified, uncomplicated: Secondary | ICD-10-CM | POA: Diagnosis present

## 2018-12-15 DIAGNOSIS — Z781 Physical restraint status: Secondary | ICD-10-CM | POA: Diagnosis not present

## 2018-12-15 DIAGNOSIS — Z79890 Hormone replacement therapy (postmenopausal): Secondary | ICD-10-CM

## 2018-12-15 DIAGNOSIS — Z87442 Personal history of urinary calculi: Secondary | ICD-10-CM | POA: Diagnosis not present

## 2018-12-15 DIAGNOSIS — R471 Dysarthria and anarthria: Secondary | ICD-10-CM | POA: Diagnosis present

## 2018-12-15 DIAGNOSIS — Z720 Tobacco use: Secondary | ICD-10-CM | POA: Diagnosis present

## 2018-12-15 DIAGNOSIS — H53462 Homonymous bilateral field defects, left side: Secondary | ICD-10-CM | POA: Diagnosis present

## 2018-12-15 DIAGNOSIS — R29706 NIHSS score 6: Secondary | ICD-10-CM | POA: Diagnosis present

## 2018-12-15 DIAGNOSIS — I358 Other nonrheumatic aortic valve disorders: Secondary | ICD-10-CM | POA: Diagnosis present

## 2018-12-15 DIAGNOSIS — I639 Cerebral infarction, unspecified: Secondary | ICD-10-CM

## 2018-12-15 DIAGNOSIS — I10 Essential (primary) hypertension: Secondary | ICD-10-CM | POA: Diagnosis present

## 2018-12-15 DIAGNOSIS — I739 Peripheral vascular disease, unspecified: Secondary | ICD-10-CM | POA: Diagnosis present

## 2018-12-15 DIAGNOSIS — N400 Enlarged prostate without lower urinary tract symptoms: Secondary | ICD-10-CM | POA: Diagnosis present

## 2018-12-15 DIAGNOSIS — T45615A Adverse effect of thrombolytic drugs, initial encounter: Secondary | ICD-10-CM | POA: Diagnosis not present

## 2018-12-15 DIAGNOSIS — Z8249 Family history of ischemic heart disease and other diseases of the circulatory system: Secondary | ICD-10-CM

## 2018-12-15 DIAGNOSIS — Y92239 Unspecified place in hospital as the place of occurrence of the external cause: Secondary | ICD-10-CM | POA: Diagnosis present

## 2018-12-15 DIAGNOSIS — J9811 Atelectasis: Secondary | ICD-10-CM | POA: Diagnosis present

## 2018-12-15 DIAGNOSIS — I161 Hypertensive emergency: Secondary | ICD-10-CM | POA: Diagnosis present

## 2018-12-15 DIAGNOSIS — E876 Hypokalemia: Secondary | ICD-10-CM | POA: Diagnosis not present

## 2018-12-15 DIAGNOSIS — E785 Hyperlipidemia, unspecified: Secondary | ICD-10-CM | POA: Diagnosis present

## 2018-12-15 DIAGNOSIS — R4781 Slurred speech: Secondary | ICD-10-CM | POA: Diagnosis not present

## 2018-12-15 DIAGNOSIS — R1312 Dysphagia, oropharyngeal phase: Secondary | ICD-10-CM | POA: Diagnosis not present

## 2018-12-15 DIAGNOSIS — G8194 Hemiplegia, unspecified affecting left nondominant side: Secondary | ICD-10-CM | POA: Diagnosis present

## 2018-12-15 DIAGNOSIS — I6389 Other cerebral infarction: Principal | ICD-10-CM | POA: Diagnosis present

## 2018-12-15 DIAGNOSIS — R29810 Facial weakness: Secondary | ICD-10-CM | POA: Diagnosis present

## 2018-12-15 DIAGNOSIS — E039 Hypothyroidism, unspecified: Secondary | ICD-10-CM | POA: Diagnosis not present

## 2018-12-15 DIAGNOSIS — F17210 Nicotine dependence, cigarettes, uncomplicated: Secondary | ICD-10-CM | POA: Diagnosis present

## 2018-12-15 DIAGNOSIS — R1310 Dysphagia, unspecified: Secondary | ICD-10-CM | POA: Diagnosis present

## 2018-12-15 DIAGNOSIS — F419 Anxiety disorder, unspecified: Secondary | ICD-10-CM | POA: Diagnosis present

## 2018-12-15 HISTORY — DX: Anxiety disorder, unspecified: F41.9

## 2018-12-15 HISTORY — DX: Chronic obstructive pulmonary disease, unspecified: J44.9

## 2018-12-15 HISTORY — DX: Cerebral infarction, unspecified: I63.9

## 2018-12-15 HISTORY — DX: Calculus of kidney: N20.0

## 2018-12-15 HISTORY — DX: Tobacco use: Z72.0

## 2018-12-15 HISTORY — DX: Hypothyroidism, unspecified: E03.9

## 2018-12-15 HISTORY — DX: Essential (primary) hypertension: I10

## 2018-12-15 HISTORY — DX: Migraine, unspecified, not intractable, without status migrainosus: G43.909

## 2018-12-15 LAB — TSH: TSH: 3.422 u[IU]/mL (ref 0.350–4.500)

## 2018-12-15 LAB — MRSA PCR SCREENING: MRSA by PCR: NEGATIVE

## 2018-12-15 MED ORDER — LORAZEPAM 1 MG PO TABS: 1.0000 mg | ORAL_TABLET | Freq: Four times a day (QID) | ORAL | Status: DC | PRN

## 2018-12-15 MED ORDER — THIAMINE HCL 100 MG/ML IJ SOLN
100.0000 mg | Freq: Every day | INTRAMUSCULAR | Status: DC
  Administered 2018-12-16 – 2018-12-17 (×2): 100 mg via INTRAVENOUS
  Filled 2018-12-15 (×2): qty 2

## 2018-12-15 MED ORDER — ACETAMINOPHEN 325 MG PO TABS: 650.0000 mg | ORAL_TABLET | ORAL | Status: DC | PRN

## 2018-12-15 MED ORDER — VITAMIN B-1 100 MG PO TABS
100.0000 mg | ORAL_TABLET | Freq: Every day | ORAL | Status: DC
  Administered 2018-12-18: 100 mg via ORAL
  Filled 2018-12-15: qty 1

## 2018-12-15 MED ORDER — LORAZEPAM 2 MG/ML IJ SOLN
1.0000 mg | Freq: Four times a day (QID) | INTRAMUSCULAR | Status: DC | PRN
  Administered 2018-12-15 – 2018-12-16 (×2): 1 mg via INTRAVENOUS
  Filled 2018-12-15 (×2): qty 1

## 2018-12-15 MED ORDER — FOLIC ACID 1 MG PO TABS: 1.0000 mg | ORAL_TABLET | Freq: Every day | ORAL | Status: DC

## 2018-12-15 MED ORDER — ACETAMINOPHEN 160 MG/5ML PO SOLN: 650.0000 mg | ORAL | Status: DC | PRN

## 2018-12-15 MED ORDER — LABETALOL HCL 5 MG/ML IV SOLN
20.0000 mg | Freq: Once | INTRAVENOUS | Status: DC
  Filled 2018-12-15: qty 4

## 2018-12-15 MED ORDER — STROKE: EARLY STAGES OF RECOVERY BOOK
Freq: Once | Status: AC
  Administered 2018-12-18: 08:00:00
  Filled 2018-12-15: qty 1

## 2018-12-15 MED ORDER — ADULT MULTIVITAMIN W/MINERALS CH
1.0000 | ORAL_TABLET | Freq: Every day | ORAL | Status: DC
  Administered 2018-12-18: 1 via ORAL
  Filled 2018-12-15: qty 1

## 2018-12-15 MED ORDER — ACETAMINOPHEN 650 MG RE SUPP: 650.0000 mg | RECTAL | Status: DC | PRN

## 2018-12-15 MED ORDER — PANTOPRAZOLE SODIUM 40 MG IV SOLR
40.0000 mg | Freq: Every day | INTRAVENOUS | Status: DC
  Administered 2018-12-15 – 2018-12-16 (×2): 40 mg via INTRAVENOUS
  Filled 2018-12-15 (×2): qty 40

## 2018-12-15 MED ORDER — SODIUM CHLORIDE 0.9 % IV SOLN
INTRAVENOUS | Status: DC
  Administered 2018-12-15 – 2018-12-17 (×4): via INTRAVENOUS

## 2018-12-15 MED ORDER — SENNOSIDES-DOCUSATE SODIUM 8.6-50 MG PO TABS: 1.0000 | ORAL_TABLET | Freq: Every evening | ORAL | Status: DC | PRN

## 2018-12-15 MED ORDER — CLEVIDIPINE BUTYRATE 0.5 MG/ML IV EMUL
0.0000 mg/h | INTRAVENOUS | Status: DC
  Administered 2018-12-15: 1 mg/h via INTRAVENOUS
  Administered 2018-12-16: 4 mg/h via INTRAVENOUS
  Administered 2018-12-16: 6 mg/h via INTRAVENOUS
  Administered 2018-12-17 (×3): 5 mg/h via INTRAVENOUS
  Filled 2018-12-15 (×6): qty 50
  Filled 2018-12-15: qty 100
  Filled 2018-12-15: qty 50

## 2018-12-15 NOTE — H&P (Addendum)
Neurology history and physical    HPI: Jordan Sawyer is a 73 y.o. male with history of tobacco abuse, migraines, hypertension, COPD.  [facility] weakness since 6:30 in the morning.  Patient stated that when he woke up he was feeling normal however when he walked to the kitchen and started feeling that his left leg became weak.  He then felt as if he was having difficulty walking and noted that his upper extremity on the left was also weak.  When talking to his wife she noticed slurred speech and a facial droop.  Code stroke was called and CT was obtained showing no acute bleed.  Patient was at that time evaluated by tele-neurology who recommended giving tPA.  [facility] and further stroke evaluation and care.  Currently patient still feels as though he has left-sided weakness and slurred speech.   LKW: 6:30 this morning tpa given?:  Yes Premorbid modified Rankin scale (mRS): 0 NIH stroke score of 6   ROS: A 14 point ROS was performed and is negative except as noted in the HPI.   Past Medical History:  Diagnosis Date  . Anxiety   . COPD (chronic obstructive pulmonary disease) (HCC)   . Hypertension   . Hypothyroidism   . Kidney stones   . Migraine   . Tobacco abuse      Family History  Problem Relation Age of Onset  . Hypertension Mother   . Hypertension Father      Social History:   reports that he has been smoking. He does not have any smokeless tobacco history on file. No history on file for alcohol and drug.  Medications  Current Facility-Administered Medications:  .   stroke: mapping our early stages of recovery book, , Does not apply, Once, Jordan Sawyer, Jordan Barillas, MD .  0.9 %  sodium chloride infusion, , Intravenous, Continuous, Jordan Sawyer, Jordan Jentz, MD .  acetaminophen (TYLENOL) tablet 650 mg, 650 mg, Oral, Q4H PRN **OR** acetaminophen (TYLENOL) solution 650 mg, 650 mg, Per Tube, Q4H PRN **OR**  acetaminophen (TYLENOL) suppository 650 mg, 650 mg, Rectal, Q4H PRN, Jordan Sawyer, Jordan Prisk, MD .  labetalol (NORMODYNE) injection 20 mg, 20 mg, Intravenous, Once **AND** clevidipine (CLEVIPREX) infusion 0.5 mg/mL, 0-21 mg/hr, Intravenous, Continuous, Jordan Sawyer, Jordan Maskell, MD .  pantoprazole (PROTONIX) injection 40 mg, 40 mg, Intravenous, QHS, Jordan Sawyer, Jordan Narvaez, MD .  senna-docusate (Senokot-S) tablet 1 tablet, 1 tablet, Oral, QHS PRN, Jordan Sawyer, Jordan Klahn, MD   Exam: Current vital signs: BP (!) 154/119   Pulse (!) 29   Temp 97.9 F (36.6 C)   Resp 14   SpO2 96%  Vital signs in last 24 hours: Temp:  [97.9 F (36.6 C)] 97.9 F (36.6 C) (06/30 1430) Pulse Rate:  [29-96] 29 (06/30 1615) Resp:  [9-25] 14 (06/30 1615) BP: (143-170)/(86-141) 154/119 (06/30 1615) SpO2:  [94 %-98 %] 96 % (06/30 1615)  Physical Exam  Constitutional: Appears well-developed and well-nourished.  Psych: Affect appropriate to situation Eyes: No scleral injection HENT: No OP obstrucion Head: Normocephalic.  Cardiovascular: Normal rate and regular rhythm.  Respiratory: Effort normal, non-labored breathing GI: Soft.  No distension. There is no tenderness.  Skin: WDI  Neuro: Mental Status: Patient is awake, alert, oriented to person, place, month, year, and situation. Patient is able to give a clear and coherent history. Voice is dysarthric Cranial Nerves: II: Partial hemianopsia on the left III,IV, VI: Right gaze preference but able to cross midline.  He does have saccadic eye movements when looking to the left. Pupils equal, round and reactive to light V: Facial sensation is symmetric to temperature VII: Left facial droop VIII: hearing is intact to voice X: Palate elevates symmetrically XI: Shoulder shrug is symmetric. XII: tongue is midline without atrophy or fasciculations.  Motor: Tone is normal. Bulk is normal. 5/5 strength was present in all four extremities.  Drift with both left arm and leg Sensory: Sensation is  symmetric to light touch and temperature in the arms and legs. Deep Tendon Reflexes: 2+ and symmetric in the biceps and patellae.  Plantars: Toes are downgoing bilaterally.  Cerebellar: FNF show some mild dysmetria on the left however likely secondary to weakness.  Same with heel-to-shin  Labs I have reviewed labs in epic and the results pertinent to this consultation are:   CBC No results found for: WBC, RBC, HGB, HCT, PLT, MCV, MCH, MCHC, RDW, LYMPHSABS, MONOABS, EOSABS, BASOSABS  CMP  No results found for: NA, K, CL, CO2, GLUCOSE, BUN, CREATININE, CALCIUM, PROT, ALBUMIN, AST, ALT, ALKPHOS, BILITOT, GFRNONAA, GFRAA  Lipid Panel  No results found for: CHOL, TRIG, HDL, CHOLHDL, VLDL, LDLCALC, LDLDIRECT   Imaging I have reviewed the images obtained:  CT-scan of the brain-no emergent finding. There is atrophy and extensive chronic small vessel ischemia  MRI examination of the brain-pending  Jordan MornDavid Smith PA-C [facility] [phone] 12/15/2018, 4:20 PM    Assessment: 73 year old male with acute ischemic stroke affecting the left side.  Patient received tPA and [facility].  1. NIHSS of 4 at OSH. [facility].  2. CTA at OSH revealed no LVO.  3. Stroke risk factors: HTN and tobacco abuse     Plan:  Admit to ICU for management of acute Ischemic stroke s/p tPA Further stroke evaluation and care will be taken over by stroke team on 6/31/2020 Acuity: Acute Current Suspected Etiology: in situ thrombosis versus cardioembolic or artery-to-artery embolization versus small vessel Continue Evaluation:  -Admit to: ICU -Start statin -Hold Aspirin until 24 hour post tPA neuroimaging is stable and without evidence of bleeding -Blood pressure control, goal of SBP < 180 post-tPA -MRI/ECHO/A1C/Lipid panel. -Hyperglycemia management per SSI to maintain glucose 140-180mg /dL. -PT/OT/ST therapies and recommendations when  able  CNS -Close neuro monitoring Dysarthria -NPO until cleared by speech -ST -Advance diet as tolerated Hemiplegia and hemiparesis following cerebral infarction affecting left non-dominant side  -PT/OT -PM&R consult  Pulmonary - History of COPD. No meds listed in Epic. Will need Pharmacy assistance to obtain home meds list.   History of anxiety. May be on home benzo and therefore potentially at risk for withdrawal syndrome. Ordering CIWA protocol. Will need Pharmacy assistance to obtain home meds list (consult has been ordered).   CV Essential (primary) hypertension  Hyperlipidemia, unspecified  - Statin for goal LDL < 70  HEME -transfuse for hgb < 7  ENDO -goal HgbA1c < 7 -History of hypothyroidism. No medications listed in Epic. Will need pharmacy assistance to obtain home synthroid dose. Ordering TSH level.   GI/GU No current issues  Fluid/Electrolyte Disorders Normal saline  Nutrition Heart healthy diet when able to eat  Prophylaxis DVT: SCDs GI: Protonix Bowel: Senokot  Diet: NPO until cleared by speech  Code Status: Full Code   I have seen and examined the patient. I have reviewed the assessment and plan with Jordan Mornavid Smith, PA. 73 year old male with acute ischemic stroke. Received tPA at  OSH. Work up and Mudlogger as above.  Electronically signed: Dr. Kerney Elbe

## 2018-12-16 ENCOUNTER — Inpatient Hospital Stay (HOSPITAL_COMMUNITY): Payer: Medicare PPO

## 2018-12-16 ENCOUNTER — Other Ambulatory Visit: Payer: Self-pay

## 2018-12-16 DIAGNOSIS — R1312 Dysphagia, oropharyngeal phase: Secondary | ICD-10-CM

## 2018-12-16 DIAGNOSIS — I63311 Cerebral infarction due to thrombosis of right middle cerebral artery: Secondary | ICD-10-CM

## 2018-12-16 DIAGNOSIS — F17200 Nicotine dependence, unspecified, uncomplicated: Secondary | ICD-10-CM

## 2018-12-16 DIAGNOSIS — R451 Restlessness and agitation: Secondary | ICD-10-CM

## 2018-12-16 DIAGNOSIS — I639 Cerebral infarction, unspecified: Secondary | ICD-10-CM

## 2018-12-16 LAB — BASIC METABOLIC PANEL
Anion gap: 10 (ref 5–15)
BUN: 17 mg/dL (ref 8–23)
CO2: 20 mmol/L — ABNORMAL LOW (ref 22–32)
Calcium: 8.9 mg/dL (ref 8.9–10.3)
Chloride: 107 mmol/L (ref 98–111)
Creatinine, Ser: 1.07 mg/dL (ref 0.61–1.24)
GFR calc Af Amer: >60 mL/min (ref >60)
GFR calc non Af Amer: >60 mL/min (ref >60)
Glucose, Bld: 107 mg/dL — ABNORMAL HIGH (ref 70–99)
Potassium: 3.5 mmol/L (ref 3.5–5.1)
Sodium: 137 mmol/L (ref 135–145)

## 2018-12-16 LAB — CBC
HCT: 41.1 % (ref 39.0–52.0)
Hemoglobin: 13.8 g/dL (ref 13.0–17.0)
MCH: 29.9 pg (ref 26.0–34.0)
MCHC: 33.6 g/dL (ref 30.0–36.0)
MCV: 89 fL (ref 80.0–100.0)
Platelets: 200 10*3/uL (ref 150–400)
RBC: 4.62 MIL/uL (ref 4.22–5.81)
RDW: 13.2 % (ref 11.5–15.5)
WBC: 11.9 10*3/uL — ABNORMAL HIGH (ref 4.0–10.5)
nRBC: 0 % (ref 0.0–0.2)

## 2018-12-16 LAB — LIPID PANEL
Cholesterol: 136 mg/dL (ref 0–200)
HDL: 49 mg/dL (ref >40)
LDL Cholesterol: 76 mg/dL (ref 0–99)
Total CHOL/HDL Ratio: 2.8 RATIO
Triglycerides: 56 mg/dL (ref <150)
VLDL: 11 mg/dL (ref 0–40)

## 2018-12-16 LAB — HEMOGLOBIN A1C
Hgb A1c MFr Bld: 5 % (ref 4.8–5.6)
Mean Plasma Glucose: 96.8 mg/dL

## 2018-12-16 LAB — RAPID URINE DRUG SCREEN, HOSP PERFORMED
Amphetamines: NOT DETECTED
Barbiturates: NOT DETECTED
Benzodiazepines: POSITIVE — AB
Cocaine: NOT DETECTED
Opiates: NOT DETECTED
Tetrahydrocannabinol: NOT DETECTED

## 2018-12-16 LAB — ECHOCARDIOGRAM COMPLETE

## 2018-12-16 MED ORDER — FOLIC ACID 5 MG/ML IJ SOLN
1.0000 mg | Freq: Every day | INTRAMUSCULAR | Status: DC
  Administered 2018-12-16 – 2018-12-17 (×2): 1 mg via INTRAVENOUS
  Filled 2018-12-16 (×2): qty 0.2

## 2018-12-16 MED ORDER — MORPHINE SULFATE (PF) 2 MG/ML IV SOLN
2.0000 mg | Freq: Three times a day (TID) | INTRAVENOUS | Status: DC | PRN
  Administered 2018-12-16: 2 mg via INTRAVENOUS
  Filled 2018-12-16: qty 1

## 2018-12-16 MED ORDER — NICOTINE 21 MG/24HR TD PT24
21.0000 mg | MEDICATED_PATCH | Freq: Every day | TRANSDERMAL | Status: DC
  Administered 2018-12-16 – 2018-12-18 (×3): 21 mg via TRANSDERMAL
  Filled 2018-12-16 (×3): qty 1

## 2018-12-16 MED ORDER — CHLORHEXIDINE GLUCONATE CLOTH 2 % EX PADS
6.0000 | MEDICATED_PAD | Freq: Every day | CUTANEOUS | Status: DC
  Administered 2018-12-16 – 2018-12-17 (×2): 6 via TOPICAL

## 2018-12-16 MED ORDER — HALOPERIDOL LACTATE 5 MG/ML IJ SOLN
2.0000 mg | Freq: Four times a day (QID) | INTRAMUSCULAR | Status: DC | PRN
  Filled 2018-12-16: qty 1

## 2018-12-16 NOTE — Progress Notes (Signed)
Family members called and updated.

## 2018-12-16 NOTE — Progress Notes (Signed)
  Echocardiogram 2D Echocardiogram has been performed.  Jordan Sawyer 12/16/2018, 1:46 PM

## 2018-12-16 NOTE — Progress Notes (Signed)
OT Cancellation Note  Patient Details Name: Jordan Sawyer MRN: [id number] DOB: [birth year]   Cancelled Treatment:    Reason Eval/Treat Not Completed: Active bedrest order(Will return as schedule allows. Thank you.)  [place], OTR/L Acute Rehab Pager: [phone] Office: [phone] 12/16/2018, 7:26 AM

## 2018-12-16 NOTE — Progress Notes (Signed)
Stat Patterson reported small thalamic bleed on left.    RECS: -CTH in 6 hours.  - Do not see a role of reversal of tPA this far out -Will watch clinically and with imaging. -BP goal SBP<160 given small area of hemorrhage.  -- Amie Portland, MD [facility] Pager: [phone] If 7pm to 7am, please call on call as listed on AMION.

## 2018-12-16 NOTE — Progress Notes (Signed)
STROKE TEAM PROGRESS NOTE   INTERVAL HISTORY Pt RN at bedside. Pt is restless, but orientated to self and age and month. Not to place, year or situation. Still has left hemiparesis. MRI showed left small thalamic ICH and left BG/CR acute infarct.   Talked with wife over the phone, pt not use alcohol or illicit drug at home. Smoke 2PPD for 50 years. He uses pain meds every day tid with hydrocodone 5-325 tid.   Vitals:   12/16/18 0530 12/16/18 0600 12/16/18 0700 12/16/18 0800  BP: 137/89 128/82 120/84 134/83  Pulse:   (!) 103 98  Resp:  (!) 25 17 (!) 23  Temp:    97.9 F (36.6 C)  TempSrc:    Oral  SpO2:  95% 97% 96%    CBC: No results for input(s): WBC, NEUTROABS, HGB, HCT, MCV, PLT in the last 168 hours.  Basic Metabolic Panel: No results for input(s): NA, K, CL, CO2, GLUCOSE, BUN, CREATININE, CALCIUM, MG, PHOS in the last 168 hours. Lipid Panel:     Component Value Date/Time   CHOL 136 12/16/2018 0502   TRIG 56 12/16/2018 0502   HDL 49 12/16/2018 0502   CHOLHDL 2.8 12/16/2018 0502   VLDL 11 12/16/2018 0502   LDLCALC 76 12/16/2018 0502   HgbA1c:  Lab Results  Component Value Date   HGBA1C 5.0 12/16/2018   Urine Drug Screen: No results found for: LABOPIA, COCAINSCRNUR, LABBENZ, AMPHETMU, THCU, LABBARB  Alcohol Level No results found for: ETH  IMAGING Ct Head Wo Contrast  Result Date: 12/16/2018 CLINICAL DATA:  73 y/o M; acute stroke affecting the left side. Patient received tPA and [facility] Follow-up evaluation. EXAM: CT HEAD WITHOUT CONTRAST TECHNIQUE: Contiguous axial images were obtained from the base of the skull through the vertex without intravenous contrast. COMPARISON:  12/15/2018 CT head and CTA head. FINDINGS: Brain: Mild motion artifact. New 8 mm hyperdensity within the right lateral thalamus/posterior limb of internal capsule (series 3, image 21) compatible with interval acute hemorrhage. No new large vascular territory acute infarction,  extra-axial collection, hydrocephalus or mass effect. Stable nonspecific white matter hypodensities compatible with chronic microvascular ischemic changes and stable volume loss of the brain. Vascular: Calcific atherosclerosis of the internal carotid arteries. Skull: Normal. Negative for fracture or focal lesion. Sinuses/Orbits: No acute finding. Other: None. IMPRESSION: 1. New 8 mm acute hemorrhage in the right lateral thalamus/posterior limb of internal capsule. 2. No additional findings for large vascular territory infarction, hemorrhage, or mass effect. 3. Stable chronic microvascular ischemic changes and parenchymal volume loss of the brain. Critical Value/emergent results were called by telephone at the time of interpretation on 12/16/2018 at 1:57 am to Dr. Milon DikesASHISH ARORA , who verbally acknowledged these results. Electronically Signed   By: Mitzi HansenLance  Furusawa-Stratton M.D.   On: 12/16/2018 01:59   Mr Brain Wo Contrast  Result Date: 12/16/2018 CLINICAL DATA:  Stroke.  Post tPA EXAM: MRI HEAD WITHOUT CONTRAST TECHNIQUE: Multiplanar, multiecho pulse sequences of the brain and surrounding structures were obtained without intravenous contrast. COMPARISON:  CT head 12/16/2018 FINDINGS: Incomplete study. Axial and coronal diffusion only obtained. These are degraded by motion. Small area of acute infarct in the right lenticular nucleus extending into the corona radiata. No other acute infarct. Generalized atrophy. Small focus of hemorrhage right thalamus as noted on CT earlier today. IMPRESSION: Limited study Acute infarct right lenticular nucleus extending into the corona radiata Small hemorrhage right  thalamus, unchanged from earlier CT. Electronically Signed  By: Franchot Gallo M.D.   On: 12/16/2018 10:13   Dg Chest Port 1 View  Result Date: 12/16/2018 CLINICAL DATA:  Respiratory failure EXAM: PORTABLE CHEST 1 VIEW COMPARISON:  Portable exam 0551 hours compared to 11/16/2016 FINDINGS: Normal heart size,  mediastinal contours, and pulmonary vascularity. Atherosclerotic calcification aorta. Minimal bibasilar atelectasis. Lungs otherwise clear. No acute infiltrate, pleural effusion or pneumothorax. Bones demineralized. IMPRESSION: Minimal bibasilar atelectasis. Electronically Signed   By: Lavonia Dana M.D.   On: 12/16/2018 08:43    PHYSICAL EXAM  Temp:  [97.9 F (36.6 C)-98.4 F (36.9 C)] 97.9 F (36.6 C) (07/01 0800) Pulse Rate:  [29-111] 104 (07/01 1000) Resp:  [9-29] 24 (07/01 1000) BP: (119-186)/(69-141) 120/75 (07/01 1000) SpO2:  [52 %-100 %] 93 % (07/01 1000)  General - Well nourished, well developed, restless on posey belt.  Ophthalmologic - fundi not visualized due to noncooperation.  Cardiovascular - Regular rhythm, but mild tachycardia.  Neuro - slight drowsy, sleepy, eyes open to voice. Able to follow some simple commands but not all of them. Orientated to self age and month, but not to year or people or situation. Paucity of speech. Not cooperative with naming or repetition. Perseveration. Right gaze preference but able to have left gaze and attending to both sides. PERRL, inconsistently blinking to visual threat bilaterally. Left facial droop. Tongue protrusion midline. RUE and RLE strong movement, at least 4/5. LUE 3/5 with drift and LLE 2+/5 not against gravity. DTR 1+ and no babinski. Sensation, coordination not cooperative and gait not tested.   ASSESSMENT/PLAN [name] is a 73 y.o. male with history of tobacco abuse, migraines, HTN, COPD presenting to [facility] with left-sided weakness, slurred speech and facial droop.  Received tPA at [place] on 12/15/2018.  Stroke:  R lenticular/corona radiata infarct s/p tPA with post tPA with HT at R thalamux - infarct secondary to small vessel disease source  CT head [facility] no hemorrhage   CT head 7/1 3382 R thalamic/PLIC ICH. Small vessel disease. Atrophy.   MRI 7/1 5053 R lenticular nucleus/corona  radiata infarct. Small R thalamic hmg unchanges.  2D Echo EF > 65%  LDL 76  HgbA1c 5.0  SCDs for VTE prophylaxis  No meds listed prior to admission, not on No antithrombotic given hemorrhagic conversion seen on CT.   Therapy recommendations:  pending   Disposition:  pending   Agitation and restless  On CIWA protocol, however, confirmed with wife that pt not drinking alcohol at home  2PPD for 50 years, put on nicotine patch  Hydrocodone 5/325 tid daily at home - put on morphine Q8h  On restrain  Close monitoring  Resume home meds once passed swallow  Hypertension  Home meds: No home meds listed  Stable SBP goal < 160 given post tPA HMG On cleviprex Add home meds once passed swallow or po access . Long-term BP goal normotensive  Hyperlipidemia  Home meds: No home meds listed  LDL 76, goal < 70  Will add low dose statin at discharge  Dysphagia . Secondary to stroke . NPO . Swallow following . Resume po meds once passed swallow   Tobacco abuse  Current smoker  Smoking cessation counseling will be provided  Nicotine patch provided   Other Stroke Risk Factors  Advanced age  Migraines  Narcotic user  Other Active Problems  Hypothyroidism  COPD  [facility] day # 1  This patient is critically ill due to acute stroke, status post TPA,  with hemorrhagic conversion, agitation and restless, hypertensive emergency and at significant risk of neurological worsening, death form recurrent stroke, hemorrhagic conversion, hematoma expansion, hypertensive encephalopathy. This patient's care requires constant monitoring of vital signs, hemodynamics, respiratory and cardiac monitoring, review of multiple databases, neurological assessment, discussion with family, other specialists and medical decision making of high complexity. I spent 40 minutes of neurocritical care time in the care of this patient. I had long discussion with wife over the phone,  updated pt current condition, treatment plan and potential prognosis.  She expressed understanding and appreciation.    Marvel PlanJindong Gwendlyon Zumbro, MD PhD Stroke Neurology 12/16/2018 5:42 PM    To contact Stroke Continuity provider, please refer to [email or web address]. After hours, contact General Neurology

## 2018-12-16 NOTE — Evaluation (Signed)
Clinical/Bedside Swallow Evaluation Patient Details  Name: Jordan Sawyer MRN: [id number] Date of Birth: Oct 24, 1945  Today's Date: 12/16/2018 Time: SLP Start Time (ACUTE ONLY): 3086 SLP Stop Time (ACUTE ONLY): 0936 SLP Time Calculation (min) (ACUTE ONLY): 13 min  Past Medical History:  Past Medical History:  Diagnosis Date  . Anxiety   . COPD (chronic obstructive pulmonary disease) ([place])   . Hypertension   . Hypothyroidism   . Kidney stones   . Migraine   . Tobacco abuse    Past Surgical History: The histories are not reviewed yet. Please review them in the "History" navigator section and refresh this [place]. HPI:  Pt is a 73 yo male presenting to [facility] with L sided weakness and slurred speech. He received tPA and was transferred to [facility]. CIWA protocol also initiated. Repeat CTH showed a R small thalamic bleed. MRI pending.   Assessment / Plan / Recommendation Clinical Impression  Pt had very quick fluctuations between being alert and following simple commands to being sound asleep with difficulty waking. Given Max cues, he consumed a single spoonful of water, with swallow initiated without coughing but anterior spillage from oral cavity also noted. Pt could not be sufficiently aroused for additional trials. Per RN, pt recently received ativan. Anticipate good prognosis for diet advancement as alertness improves, but for now, would maintain NPO status. Dysarthria was noted today but will also f/u for speech/language evaluation when more alert as lethargy is felt to be related to medications.   SLP Visit Diagnosis: Dysphagia, unspecified (R13.10)    Aspiration Risk  Moderate aspiration risk    Diet Recommendation NPO   Medication Administration: Via alternative means    Other  Recommendations Oral Care Recommendations: Oral care QID   Follow up Recommendations Inpatient Rehab      Frequency and Duration min 2x/week  2 weeks       Prognosis Prognosis for  Safe Diet Advancement: Good Barriers to Reach Goals: Cognitive deficits      Swallow Study   General HPI: Pt is a 73 yo male presenting to [facility] with L sided weakness and slurred speech. He received tPA and was transferred to [facility]. CIWA protocol also initiated. Repeat CTH showed a R small thalamic bleed. MRI pending. Type of Study: Bedside Swallow Evaluation Previous Swallow Assessment: none in chart Diet Prior to this Study: NPO Temperature Spikes Noted: No Respiratory Status: Room air History of Recent Intubation: No Behavior/Cognition: Lethargic/Drowsy Oral Cavity Assessment: Within Functional Limits Oral Care Completed by SLP: Yes Oral Cavity - Dentition: Missing dentition Self-Feeding Abilities: Total assist Patient Positioning: Upright in bed Baseline Vocal Quality: Normal Volitional Swallow: Unable to elicit    Oral/Motor/Sensory Function Overall Oral Motor/Sensory Function: Mild impairment Facial ROM: Reduced left;Suspected CN VII (facial) dysfunction Facial Symmetry: Abnormal symmetry left;Suspected CN VII (facial) dysfunction Facial Strength: Reduced left;Suspected CN VII (facial) dysfunction Lingual Symmetry: Within Functional Limits   Ice Chips Ice chips: Impaired Presentation: Spoon Oral Phase Impairments: Poor awareness of bolus;Other (comment)(no acceptance)   Thin Liquid Thin Liquid: Impaired Presentation: Spoon Oral Phase Impairments: Reduced labial seal;Poor awareness of bolus Oral Phase Functional Implications: Left anterior spillage    Nectar Thick Nectar Thick Liquid: Not tested   Honey Thick Honey Thick Liquid: Not tested   Puree Puree: Not tested   Solid     Solid: Not tested      Venita Sheffield Gregroy Dombkowski 12/16/2018,9:43 AM  Pollyann Glen, M.A. [place] Acute Environmental education officer [phone] Office [phone]

## 2018-12-16 NOTE — Progress Notes (Signed)
PT Cancellation Note  Patient Details Name: Jordan Sawyer MRN: [id number] DOB: 12/29/1945   Cancelled Treatment:    Reason Eval/Treat Not Completed: Active bedrest order   Ellamae Sia, PT, DPT Acute Rehabilitation Services Pager [phone] Office [phone]    Willy Eddy 12/16/2018, 3:14 PM

## 2018-12-16 NOTE — Progress Notes (Addendum)
Discuss with neurologist that patient's diastolic blood pressure is above 105 when cleviprex is titrated down.  Patient is very restless and moves arm a lot.  MD aware of diastolic and wants systolic less than 831 only.  RN to continue to monitor.      MD updated patient's wife.    Patient TPA at [phone], 12/15/2018 at [facility] per report.

## 2018-12-16 NOTE — Progress Notes (Signed)
Patient with high CIWA per RN. Ativan per ciwa protocol ordered.  RN called  Back a few hours later with patient's modified NIHSS now 5 points worse (after ativan).  Getting a CTH stat to r/o bleed.  -- Amie Portland, MD [facility] Pager: [phone] If 7pm to 7am, please call on call as listed on AMION.

## 2018-12-16 NOTE — Progress Notes (Signed)
Patient very restless during MRI scan, Stroke MD paged.  Limited MRI study done and patient transported back to 4NICU.  No new orders at this time.

## 2018-12-17 ENCOUNTER — Inpatient Hospital Stay (HOSPITAL_COMMUNITY): Payer: Medicare PPO

## 2018-12-17 DIAGNOSIS — E876 Hypokalemia: Secondary | ICD-10-CM

## 2018-12-17 DIAGNOSIS — E785 Hyperlipidemia, unspecified: Secondary | ICD-10-CM

## 2018-12-17 DIAGNOSIS — I10 Essential (primary) hypertension: Secondary | ICD-10-CM

## 2018-12-17 LAB — BASIC METABOLIC PANEL
Anion gap: 13 (ref 5–15)
BUN: 15 mg/dL (ref 8–23)
CO2: 22 mmol/L (ref 22–32)
Calcium: 8.8 mg/dL — ABNORMAL LOW (ref 8.9–10.3)
Chloride: 103 mmol/L (ref 98–111)
Creatinine, Ser: 0.99 mg/dL (ref 0.61–1.24)
GFR calc Af Amer: >60 mL/min (ref >60)
GFR calc non Af Amer: >60 mL/min (ref >60)
Glucose, Bld: 83 mg/dL (ref 70–99)
Potassium: 3.4 mmol/L — ABNORMAL LOW (ref 3.5–5.1)
Sodium: 138 mmol/L (ref 135–145)

## 2018-12-17 LAB — CBC
HCT: 43.5 % (ref 39.0–52.0)
Hemoglobin: 14.5 g/dL (ref 13.0–17.0)
MCH: 29.9 pg (ref 26.0–34.0)
MCHC: 33.3 g/dL (ref 30.0–36.0)
MCV: 89.7 fL (ref 80.0–100.0)
Platelets: 198 10*3/uL (ref 150–400)
RBC: 4.85 MIL/uL (ref 4.22–5.81)
RDW: 13.1 % (ref 11.5–15.5)
WBC: 8.9 10*3/uL (ref 4.0–10.5)
nRBC: 0 % (ref 0.0–0.2)

## 2018-12-17 MED ORDER — ACETAMINOPHEN 325 MG PO TABS: 650.0000 mg | ORAL_TABLET | Freq: Four times a day (QID) | ORAL | Status: DC | PRN

## 2018-12-17 MED ORDER — VENLAFAXINE HCL ER 150 MG PO CP24
150.0000 mg | ORAL_CAPSULE | Freq: Every day | ORAL | Status: DC
  Administered 2018-12-18: 150 mg via ORAL
  Filled 2018-12-17 (×2): qty 1

## 2018-12-17 MED ORDER — LEVOTHYROXINE SODIUM 50 MCG PO TABS
50.0000 ug | ORAL_TABLET | Freq: Every day | ORAL | Status: DC
  Administered 2018-12-18: 50 ug via ORAL
  Filled 2018-12-17: qty 1

## 2018-12-17 MED ORDER — LAMOTRIGINE 100 MG PO TABS
200.0000 mg | ORAL_TABLET | Freq: Every day | ORAL | Status: DC
  Administered 2018-12-17 – 2018-12-18 (×2): 200 mg via ORAL
  Filled 2018-12-17 (×2): qty 2

## 2018-12-17 MED ORDER — PANTOPRAZOLE SODIUM 40 MG PO TBEC
40.0000 mg | DELAYED_RELEASE_TABLET | Freq: Every day | ORAL | Status: DC
  Administered 2018-12-17 – 2018-12-18 (×2): 40 mg via ORAL
  Filled 2018-12-17 (×2): qty 1

## 2018-12-17 MED ORDER — FOLIC ACID 1 MG PO TABS
1.0000 mg | ORAL_TABLET | Freq: Every day | ORAL | Status: DC
  Administered 2018-12-18: 11:00:00 1 mg via ORAL
  Filled 2018-12-17: qty 1

## 2018-12-17 MED ORDER — CLEVIDIPINE BUTYRATE 0.5 MG/ML IV EMUL
0.0000 mg/h | INTRAVENOUS | Status: DC
  Administered 2018-12-17: 5 mg/h via INTRAVENOUS
  Administered 2018-12-18: 3 mg/h via INTRAVENOUS

## 2018-12-17 MED ORDER — PRAVASTATIN SODIUM 40 MG PO TABS
40.0000 mg | ORAL_TABLET | Freq: Every day | ORAL | Status: DC
  Administered 2018-12-17: 40 mg via ORAL
  Filled 2018-12-17: qty 1

## 2018-12-17 MED ORDER — TAMSULOSIN HCL 0.4 MG PO CAPS
0.4000 mg | ORAL_CAPSULE | Freq: Two times a day (BID) | ORAL | Status: DC
  Administered 2018-12-17 – 2018-12-18 (×2): 0.4 mg via ORAL
  Filled 2018-12-17 (×2): qty 1

## 2018-12-17 MED ORDER — LISINOPRIL 10 MG PO TABS: 10.0000 mg | ORAL_TABLET | Freq: Every day | ORAL | Status: DC

## 2018-12-17 MED ORDER — POTASSIUM CHLORIDE CRYS ER 20 MEQ PO TBCR
40.0000 meq | EXTENDED_RELEASE_TABLET | ORAL | Status: AC
  Administered 2018-12-17 (×2): 40 meq via ORAL
  Filled 2018-12-17 (×2): qty 2

## 2018-12-17 MED ORDER — HYDROCODONE-ACETAMINOPHEN 5-325 MG PO TABS: 1.0000 | ORAL_TABLET | Freq: Four times a day (QID) | ORAL | Status: DC | PRN

## 2018-12-17 MED ORDER — LISINOPRIL 20 MG PO TABS
20.0000 mg | ORAL_TABLET | Freq: Two times a day (BID) | ORAL | Status: DC
  Administered 2018-12-18: 20 mg via ORAL
  Filled 2018-12-17: qty 1

## 2018-12-17 MED ORDER — LISINOPRIL 20 MG PO TABS
40.0000 mg | ORAL_TABLET | Freq: Once | ORAL | Status: AC
  Administered 2018-12-17: 40 mg via ORAL
  Filled 2018-12-17: qty 2

## 2018-12-17 MED ORDER — LABETALOL HCL 5 MG/ML IV SOLN: 5.0000 mg | INTRAVENOUS | Status: DC | PRN

## 2018-12-17 NOTE — Evaluation (Signed)
Physical Therapy Evaluation Patient Details Name: Jordan Sawyer MRN: [id number] DOB: Nov 27, 1945 Today's Date: 12/17/2018   History of Present Illness  Pt is a 73 y.o. M who presents with L sided weakness and slurred speech, received tPA. MRI showing acute infarct right lenticular nucleus extending into corona radiata, small hemorrhage right thalamus.   Clinical Impression  Pt admitted with above. Presents with decreased functional mobility secondary to poor dynamic balance and decreased awareness. Ambulating 15 feet with mod assist, several episodes of lateral LOB. Pt reporting left lateral hip pain which is chronic? Difficult to accurately determine baseline versus new deficits as pt is not a great historian, does report history of falls. Presents as high fall risk based on balance deficits and decreased gait speed. Will trial walker next session. Recommending CIR to maximize functional independence and decrease caregiver burden (pt may refuse as he wants to go home currently).     Follow Up Recommendations CIR;Supervision for mobility/OOB    Equipment Recommendations  None recommended by PT (has all DME)   Recommendations for Other Services Rehab consult     Precautions / Restrictions Precautions Precautions: Fall Restrictions Weight Bearing Restrictions: No      Mobility  Bed Mobility Overal bed mobility: Modified Independent                Transfers Overall transfer level: Needs assistance Equipment used: None Transfers: Sit to/from Stand Sit to Stand: Min guard            Ambulation/Gait Ambulation/Gait assistance: Mod assist Gait Distance (Feet): 15 Feet Assistive device: None Gait Pattern/deviations: Step-through pattern;Drifts right/left;Decreased stance time - left;Decreased step length - left;Decreased weight shift to left Gait velocity: decreased Gait velocity interpretation: <1.31 ft/sec, indicative of household ambulator General Gait Details: Pt  reporting increased left hip pain with weightbearing (reports this is chronic?), furniture walking, several episodes of lateral LOB requiring modA to correct  Stairs            Wheelchair Mobility    Modified Rankin (Stroke Patients Only) Modified Rankin (Stroke Patients Only) Pre-Morbid Rankin Score: No significant disability Modified Rankin: Moderately severe disability     Balance Overall balance assessment: Needs assistance;History of Falls Sitting-balance support: Feet supported Sitting balance-Leahy Scale: Good     Standing balance support: No upper extremity supported;During functional activity Standing balance-Leahy Scale: Fair Standing balance comment: Fair statically                             Pertinent Vitals/Pain Pain Assessment: Faces Faces Pain Scale: Hurts little more Pain Location: left hip Pain Descriptors / Indicators: Grimacing;Guarding Pain Intervention(s): Monitored during session    Home Living Family/patient expects to be discharged to:: Private residence Living Arrangements: Spouse/significant other;Other relatives(grandsons (older)) Available Help at Discharge: Family Type of Home: House Home Access: Level entry(side entrance)     Home Layout: One level Home Equipment: Walker - 2 wheels;Cane - single point;Shower seat      Prior Function Level of Independence: Independent with assistive device(s)         Comments: uses walker vs cane, history of falls     Hand Dominance        Extremity/Trunk Assessment   Upper Extremity Assessment Upper Extremity Assessment: RUE deficits/detail;LUE deficits/detail RUE Deficits / Details: Strength 5/5 RUE Sensation: WNL RUE Coordination: WNL LUE Deficits / Details: Strength 5/5 LUE Sensation: WNL LUE Coordination: WNL    Lower Extremity Assessment Lower  Extremity Assessment: RLE deficits/detail;LLE deficits/detail RLE Deficits / Details: Strength 5/5 RLE Sensation:  WNL RLE Coordination: WNL LLE Deficits / Details: Strength 5/5 LLE Sensation: WNL LLE Coordination: WNL       Communication   Communication: No difficulties  Cognition Arousal/Alertness: Awake/alert Behavior During Therapy: WFL for tasks assessed/performed Overall Cognitive Status: Impaired/Different from baseline Area of Impairment: Safety/judgement;Awareness                         Safety/Judgement: Decreased awareness of safety;Decreased awareness of deficits Awareness: Emergent   General Comments: A&Ox4, aware he had a stroke but not able to determine/verbalize functional deficits, stating he was back to baseline      General Comments      Exercises     Assessment/Plan    PT Assessment Patient needs continued PT services  PT Problem List Decreased balance;Decreased mobility;Decreased safety awareness       PT Treatment Interventions DME instruction;Gait training;Functional mobility training;Therapeutic activities;Therapeutic exercise;Balance training;Patient/family education    PT Goals (Current goals can be found in the Care Plan section)  Acute Rehab PT Goals Patient Stated Goal: "go home." PT Goal Formulation: With patient Time For Goal Achievement: 12/31/18 Potential to Achieve Goals: Good    Frequency Min 4X/week   Barriers to discharge        Co-evaluation               AM-PAC PT "6 Clicks" Mobility  Outcome Measure Help needed turning from your back to your side while in a flat bed without using bedrails?: None Help needed moving from lying on your back to sitting on the side of a flat bed without using bedrails?: None Help needed moving to and from a bed to a chair (including a wheelchair)?: A Little Help needed standing up from a chair using your arms (e.g., wheelchair or bedside chair)?: A Little Help needed to walk in hospital room?: A Lot Help needed climbing 3-5 steps with a railing? : A Lot 6 Click Score: 18    End of  Session Equipment Utilized During Treatment: Gait belt Activity Tolerance: Patient tolerated treatment well Patient left: in chair;with call bell/phone within reach;with chair alarm set Nurse Communication: Mobility status PT Visit Diagnosis: Unsteadiness on feet (R26.81);History of falling (Z91.81);Other symptoms and signs involving the nervous system (R29.898)    Time: [id number] PT Time Calculation (min) (ACUTE ONLY): 23 min   Charges:   PT Evaluation $PT Eval Moderate Complexity: 1 Mod PT Treatments $Therapeutic Activity: 8-22 mins        Laurina Bustlearoline Lisaanne Lawrie, PT, DPT Acute Rehabilitation Services Pager [phone] Office [phone]   Vanetta MuldersCarloine H Jenevie Casstevens 12/17/2018, 3:11 PM

## 2018-12-17 NOTE — Progress Notes (Signed)
PT Cancellation Note  Patient Details Name: Jordan Sawyer MRN: [id number] DOB: 01/15/1946   Cancelled Treatment:    Reason Eval/Treat Not Completed: Patient at procedure or test/unavailable (MBS)  Ellamae Sia, PT, DPT Acute Rehabilitation Services Pager [phone] Office [phone]    Willy Eddy 12/17/2018, 2:13 PM

## 2018-12-17 NOTE — Progress Notes (Signed)
STROKE TEAM PROGRESS NOTE   INTERVAL HISTORY Pt lying in bed, comfortably. No agitation or restless. He is orientated and not remember that he was agitated yesterday. He still on cleviprex. Passed swallow later today with MBS. Will resume home BP meds.  Vitals:   12/17/18 0800 12/17/18 0900 12/17/18 0930 12/17/18 1000  BP: (!) 164/121  (!) 144/82 (!) 151/128  Pulse: 99 (!) 101 (!) 112 (!) 109  Resp: 18 (!) 26 (!) 25 (!) 24  Temp: 98 F (36.7 C)     TempSrc: Oral     SpO2: 95% 98% 96% 91%  Weight:      Height:        CBC:  Recent Labs  Lab 12/16/18 1018 12/17/18 0613  WBC 11.9* 8.9  HGB 13.8 14.5  HCT 41.1 43.5  MCV 89.0 89.7  PLT 200 614    Basic Metabolic Panel:  Recent Labs  Lab 12/16/18 1018 12/17/18 0613  NA 137 138  K 3.5 3.4*  CL 107 103  CO2 20* 22  GLUCOSE 107* 83  BUN 17 15  CREATININE 1.07 0.99  CALCIUM 8.9 8.8*   Lipid Panel:     Component Value Date/Time   CHOL 136 12/16/2018 0502   TRIG 56 12/16/2018 0502   HDL 49 12/16/2018 0502   CHOLHDL 2.8 12/16/2018 0502   VLDL 11 12/16/2018 0502   LDLCALC 76 12/16/2018 0502   HgbA1c:  Lab Results  Component Value Date   HGBA1C 5.0 12/16/2018   Urine Drug Screen:     Component Value Date/Time   LABOPIA NONE DETECTED 12/16/2018 1156   COCAINSCRNUR NONE DETECTED 12/16/2018 1156   LABBENZ POSITIVE (A) 12/16/2018 1156   AMPHETMU NONE DETECTED 12/16/2018 1156   THCU NONE DETECTED 12/16/2018 1156   LABBARB NONE DETECTED 12/16/2018 1156    Alcohol Level No results found for: ETH  IMAGING Ct Head Wo Contrast 12/16/2018 1. New 8 mm acute hemorrhage in the right lateral thalamus/posterior limb of internal capsule. 2. No additional findings for large vascular territory infarction, hemorrhage, or mass effect. 3. Stable chronic microvascular ischemic changes and parenchymal volume loss of the brain.   Mr Brain Wo Contrast 12/16/2018 Limited study Acute infarct right lenticular nucleus extending into the  corona radiata Small hemorrhage right  thalamus, unchanged from earlier CT.   Dg Chest Port 1 View 12/16/2018 Minimal bibasilar atelectasis.    PHYSICAL EXAM  Temp:  [97.9 F (36.6 C)-98.7 F (37.1 C)] 98 F (36.7 C) (07/02 0800) Pulse Rate:  [71-112] 109 (07/02 1000) Resp:  [16-28] 24 (07/02 1000) BP: (98-179)/(66-144) 151/128 (07/02 1000) SpO2:  [90 %-100 %] 91 % (07/02 1000) Weight:  [81 kg] 81 kg (07/01 1354)  General - Well nourished, well developed, not in acute distress.  Ophthalmologic - fundi not visualized due to noncooperation.  Cardiovascular - Regular rhythm, but mild tachycardia.  Mental Status -  Level of arousal and orientation to time, place, and person were intact. Language including expression, naming, repetition, comprehension was assessed and found intact. Moderate dysarthria  Cranial Nerves II - XII - II - Visual field intact OU. III, IV, VI - Extraocular movements intact. V - Facial sensation intact bilaterally. VII - Facial movement intact bilaterally. VIII - Hearing & vestibular intact bilaterally. X - Palate elevates symmetrically. XI - Chin turning & shoulder shrug intact bilaterally. XII - Tongue protrusion intact.  Motor Strength - The patient's strength was normal in all extremities except Left UE pronator drift.  Bulk  was normal and fasciculations were absent.   Motor Tone - Muscle tone was assessed at the neck and appendages and was normal.  Reflexes - The patient's reflexes were symmetrical in all extremities and he had no pathological reflexes.  Sensory - Light touch, temperature/pinprick were assessed and were symmetrical.    Coordination - The patient had normal movements in right hands with no ataxia or dysmetria. However, left FTN dysmetria. Tremor was absent.  Gait and Station - deferred.    ASSESSMENT/PLAN Jordan Sawyer is a 73 y.o. male with history of tobacco abuse, migraines, HTN, COPD [facility] with left-sided weakness, slurred speech and facial droop.  [facility] at [facility] on 12[facility]15/2018.  Stroke:  R lenticular/corona radiata infarct s/p tPA with post tPA with HT at R thalamus - infarct secondary to small vessel disease source  CT head [facility] no hemorrhage   CT head 7/1 0146 R thalamic/PLIC ICH. Small vessel disease. Atrophy.   MRI 7/1 [id number] R lenticular nucleus/corona radiata infarct. Small R thalamic hmg unchanges.  2D Echo EF > 65%  LDL 76  HgbA1c 5.0  SCDs for VTE prophylaxis  No meds listed prior to admission, not on No antithrombotic given hemorrhagic conversion seen on CT. Will repeat CT next Monday, if blood absorbed, will consider add ASA.   Therapy recommendations:  CIR   Disposition:  pending   Agitation and restless, resolved  2PPD for 50 years, put on nicotine patch  Hydrocodone 5/325 tid daily at home - resumed after pass swallow  Off restrain  Close monitoring  Resume home lamictal and effexor  On FA/MVI/B1  Hypertension  Home meds: lisinopril 10  Stable SBP goal < 160 given post tPA HMG On cleviprex Wean off cleviprex as able Resume home lisinopril, increase to 20mg  bid . Long-term BP goal normotensive  Hyperlipidemia  Home meds: mevacor 40  LDL 76, goal < 70  Resume pravastatin 40  Continue on discharge  Dysphagia . Secondary to stroke . Passed swallow after MBS . dys 3 thin liquid . Swallow following  Tobacco abuse  Current smoker  Smoking cessation counseling will be provided  Nicotine patch provided   Other Stroke Risk Factors  Advanced age  Migraines  Narcotic user  Other Active Problems  Hypothyroidism on synthroid  COPD  Anxiety on effexor - resume  BPH on flomax - resume  Hypokalemia 3.4 - supplement  Hospital day # 2  This patient is critically ill due to acute stroke, status post TPA, with hemorrhagic conversion, agitation and restless, hypertensive emergency and at  significant risk of neurological worsening, death form recurrent stroke, hemorrhagic conversion, hematoma expansion, hypertensive encephalopathy. This patient's care requires constant monitoring of vital signs, hemodynamics, respiratory and cardiac monitoring, review of multiple databases, neurological assessment, discussion with family, other specialists and medical decision making of high complexity. I spent 35 minutes of neurocritical care time in the care of this patient. I had long discussion with wife over the phone, updated pt current condition, treatment plan and potential prognosis.  She expressed understanding and appreciation.    Marvel PlanJindong Kjersten Ormiston, MD PhD Stroke Neurology 12/17/2018 10:58 AM    To contact Stroke Continuity provider, please refer to [email or web address]. After hours, contact General Neurology

## 2018-12-17 NOTE — Progress Notes (Signed)
Rehab Admissions Coordinator Note:  Patient was screened by Michel Santee for appropriateness for an Inpatient Acute Rehab Consult.  At this time, we are recommending Inpatient Rehab consult. Please place IP Rehab MD Consult.  Michel Santee 12/17/2018, 3:14 PM  I can be reached at [phone].

## 2018-12-17 NOTE — Progress Notes (Signed)
  Speech Language Pathology Treatment: Dysphagia  Patient Details Name: Jordan Sawyer MRN: [id number] DOB: 03/04/1946 Today's Date: 12/17/2018 Time: 1021-1173 SLP Time Calculation (min) (ACUTE ONLY): 9 min  Assessment / Plan / Recommendation Clinical Impression  He is alert, attentive and consumed thin and solid to assess swallow function. Differential diagnosis for consistent s/s aspiration with water include acute CVA, COPD or impact from suspected/pt affirmed GERD. He belched several times stating he was full after 2 sips thin. Subsequent sips resulted in strong hard coughs immediately following water. An MBS is warranted to ensure safest recommendations are made and is scheduled for 1300 today.    HPI HPI: Pt is a 73 yo male presenting to [facility] with L sided weakness and slurred speech. He received tPA and was transferred to [facility]. CIWA protocol also initiated. Repeat CTH showed a R small thalamic bleed. MRI Acute infarct right lenticular nucleus extending into the corona.       SLP Plan  New goals to be determined pending instrumental study       Recommendations  Diet recommendations: NPO                Oral Care Recommendations: Oral care QID Follow up Recommendations: (TBD) SLP Visit Diagnosis: Dysphagia, unspecified (R13.10) Plan: New goals to be determined pending instrumental study                       Houston Siren 12/17/2018, 12:08 PM  Orbie Pyo Colvin Caroli.Ed Risk analyst [phone] Office [phone]

## 2018-12-17 NOTE — Progress Notes (Signed)
Modified Barium Swallow Progress Note  Patient Details  Name: Jordan Sawyer MRN: [id number] Date of Birth: 26-Sep-1945  Today's Date: 12/17/2018  Modified Barium Swallow completed.  Full report located under Chart Review in the Imaging Section.  Brief recommendations include the following:  Clinical Impression  Pt demonstrated functional oropharyngeal swallow and suspect primary esophgeal dysphagia. Motor strength of musculature, sensation, coordination and timing of swallow mechanism is adequate. Flash penetration (2 PAS) x 1 with thin is within normal limits. He does have a prominent cricopharyngeal segment with slight hesitation intermittently through UES. Esophageal function below the UES is not diagnosed during an MBS however scan revealed barium collection in distal portion with questionable retrograde movement explaining frequent eructation with po's and premature sensation of satiey (during BSE). Recommend Dys 3 as he is missing dentition and notes difficulty masticating meats; thin liquids, pills with thin. Pt should remain upright minimum 45 min after meals. Will follow to reiterate esophageal strategies. Pt may benefit from GI work up if symptoms worsen.       Swallow Evaluation Recommendations   Recommended Consults: Consider esophageal assessment   SLP Diet Recommendations: Dysphagia 3 (Mech soft) solids;Thin liquid   Liquid Administration via: Cup;Straw   Medication Administration: Whole meds with liquid   Supervision: Patient able to self feed;Intermittent supervision to cue for compensatory strategies   Compensations: Slow rate;Small sips/bites   Postural Changes: Seated upright at 90 degrees;Remain semi-upright after after feeds/meals (Comment)   Oral Care Recommendations: Oral care BID        Houston Siren 12/17/2018,4:20 PM   Orbie Pyo [place].Ed Risk analyst [phone] Office [phone]

## 2018-12-18 ENCOUNTER — Inpatient Hospital Stay (HOSPITAL_COMMUNITY): Payer: Medicare PPO

## 2018-12-18 ENCOUNTER — Encounter (HOSPITAL_COMMUNITY): Payer: Self-pay | Admitting: Nurse Practitioner

## 2018-12-18 DIAGNOSIS — E876 Hypokalemia: Secondary | ICD-10-CM | POA: Diagnosis present

## 2018-12-18 DIAGNOSIS — J449 Chronic obstructive pulmonary disease, unspecified: Secondary | ICD-10-CM | POA: Diagnosis present

## 2018-12-18 DIAGNOSIS — E785 Hyperlipidemia, unspecified: Secondary | ICD-10-CM | POA: Diagnosis present

## 2018-12-18 DIAGNOSIS — F419 Anxiety disorder, unspecified: Secondary | ICD-10-CM | POA: Diagnosis present

## 2018-12-18 DIAGNOSIS — I10 Essential (primary) hypertension: Secondary | ICD-10-CM | POA: Diagnosis present

## 2018-12-18 DIAGNOSIS — E039 Hypothyroidism, unspecified: Secondary | ICD-10-CM | POA: Diagnosis not present

## 2018-12-18 DIAGNOSIS — Z720 Tobacco use: Secondary | ICD-10-CM

## 2018-12-18 DIAGNOSIS — I619 Nontraumatic intracerebral hemorrhage, unspecified: Secondary | ICD-10-CM | POA: Diagnosis not present

## 2018-12-18 DIAGNOSIS — R451 Restlessness and agitation: Secondary | ICD-10-CM | POA: Diagnosis not present

## 2018-12-18 DIAGNOSIS — N400 Enlarged prostate without lower urinary tract symptoms: Secondary | ICD-10-CM | POA: Diagnosis present

## 2018-12-18 LAB — BASIC METABOLIC PANEL
Anion gap: 12 (ref 5–15)
BUN: 23 mg/dL (ref 8–23)
CO2: 20 mmol/L — ABNORMAL LOW (ref 22–32)
Calcium: 9.2 mg/dL (ref 8.9–10.3)
Chloride: 106 mmol/L (ref 98–111)
Creatinine, Ser: 1.29 mg/dL — ABNORMAL HIGH (ref 0.61–1.24)
GFR calc Af Amer: >60 mL/min (ref >60)
GFR calc non Af Amer: 54 mL/min — ABNORMAL LOW (ref >60)
Glucose, Bld: 107 mg/dL — ABNORMAL HIGH (ref 70–99)
Potassium: 3.8 mmol/L (ref 3.5–5.1)
Sodium: 138 mmol/L (ref 135–145)

## 2018-12-18 LAB — CBC
HCT: 45.4 % (ref 39.0–52.0)
Hemoglobin: 15.1 g/dL (ref 13.0–17.0)
MCH: 30 pg (ref 26.0–34.0)
MCHC: 33.3 g/dL (ref 30.0–36.0)
MCV: 90.3 fL (ref 80.0–100.0)
Platelets: 214 10*3/uL (ref 150–400)
RBC: 5.03 MIL/uL (ref 4.22–5.81)
RDW: 13.1 % (ref 11.5–15.5)
WBC: 8.9 10*3/uL (ref 4.0–10.5)
nRBC: 0 % (ref 0.0–0.2)

## 2018-12-18 MED ORDER — NICOTINE 21 MG/24HR TD PT24: 21.0000 mg | MEDICATED_PATCH | Freq: Every day | TRANSDERMAL | 0 refills | Status: AC

## 2018-12-18 MED ORDER — LISINOPRIL 20 MG PO TABS: 20.0000 mg | ORAL_TABLET | Freq: Two times a day (BID) | ORAL | 2 refills | Status: AC

## 2018-12-18 NOTE — Evaluation (Signed)
Speech Language Pathology Evaluation Patient Details Name: Jordan Sawyer MRN: [id number] DOB: 03/11/1946 Today's Date: 12/18/2018 Time: 1012-1032 SLP Time Calculation (min) (ACUTE ONLY): 20 min  Problem List:  Patient Active Problem List   Diagnosis Date Noted  . Stroke (cerebrum) ([place]) 12/15/2018   Past Medical History:  Past Medical History:  Diagnosis Date  . Anxiety   . COPD (chronic obstructive pulmonary disease) ([place])   . Hypertension   . Hypothyroidism   . Kidney stones   . Migraine   . Tobacco abuse    Past Surgical History: The histories are not reviewed yet. Please review them in the "History" navigator section and refresh this [place]. HPI:  Pt is a 73 yo male presenting to [facility] with L sided weakness and slurred speech. He received tPA and was transferred to [facility]. CIWA protocol also initiated. Repeat CTH showed a R small thalamic bleed. MRI Acute infarct right lenticular nucleus extending into the corona.    Assessment / Plan / Recommendation Clinical Impression  Pt lives with his wife who manages the finances and he is responsible for meds. He reports a decreased memory at baseline but "writes things down." On the Cognistat assessment he scored within average range for all subtests except memory, His speech is 100% intelligible and mild left facial weakness. Verbally, he responsed appropriately to problem solving questions but may exhibit difficulty carrying out more complex problems. Question his anticipatory awareness and recommend he have initial supervision especially with managing his medication. Educated pt to continue using writing as a memory strategy and be sure to place information in a set place and refer to it every day. Will not pick up pt in acute care and feel he will have the necessary assist at home.        SLP Assessment  SLP Recommendation/Assessment: Patient does not need any further Speech Lanaguage Pathology Services SLP Visit  Diagnosis: Cognitive communication deficit (R41.841)    Follow Up Recommendations  None    Frequency and Duration           SLP Evaluation Cognition  Overall Cognitive Status: (suspect close to or at baseline) Arousal/Alertness: Awake/alert Attention: Sustained Sustained Attention: Appears intact Memory: Impaired Memory Impairment: Retrieval deficit;Storage deficit Awareness: (question anticipatory awareness) Problem Solving: (WFL for verbal ) Safety/Judgment: (functional- rec supervision initially)       Comprehension  Auditory Comprehension Overall Auditory Comprehension: Appears within functional limits for tasks assessed Visual Recognition/Discrimination Discrimination: Not tested Reading Comprehension Reading Status: Not tested    Expression Expression Primary Mode of Expression: Verbal Verbal Expression Overall Verbal Expression: Appears within functional limits for tasks assessed Pragmatics: No impairment Written Expression Dominant Hand: Right Written Expression: Not tested   Oral / Motor  Oral Motor/Sensory Function Overall Oral Motor/Sensory Function: Mild impairment Facial ROM: Reduced left;Suspected CN VII (facial) dysfunction Facial Symmetry: Abnormal symmetry left;Suspected CN VII (facial) dysfunction Facial Strength: Reduced left;Suspected CN VII (facial) dysfunction Lingual Symmetry: Within Functional Limits Motor Speech Overall Motor Speech: Appears within functional limits for tasks assessed Articulation: Within functional limitis Intelligibility: Intelligible Motor Planning: Witnin functional limits   GO                    Houston Siren 12/18/2018, 12:25 PM   Orbie Pyo Colvin Caroli.Ed Risk analyst [phone] Office [phone]

## 2018-12-18 NOTE — Discharge Summary (Addendum)
Stroke Discharge Summary  Patient ID: Jordan GullyMelvin C Sawyer    l   MRN: [id number]      DOB: 07/25/1945  Date of Admission: 12/15/2018 Date of Discharge: 12/18/2018  Attending Physician:  Marvel PlanXu, Metztli Sachdev, MD, Stroke MD Consultant(s):    None  Patient's PCP:  No primary care provider on file.  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 Anxiety   . COPD (chronic obstructive pulmonary disease) (HCC)   . Hypertension   . Hypothyroidism   . Kidney stones   . Migraine   . Tobacco abuse    History reviewed. No pertinent surgical history.  Allergies as of 12/18/2018   Not on File     Medication List    TAKE these medications   lamoTRIgine 200 MG tablet Commonly known as: LAMICTAL Take 200 mg by mouth daily.   levothyroxine 50 MCG tablet Commonly known as: SYNTHROID Take 50 mcg by mouth daily before breakfast.   lisinopril 20 MG tablet Commonly known as: ZESTRIL Take 1 tablet (20 mg total) by mouth 2 (two) times a day. What changed:   medication strength  how much to take  when to take this   lovastatin 40 MG tablet Commonly known as: MEVACOR Take 40 mg by mouth at bedtime.   nicotine 21 mg/24hr patch Commonly known as: NICODERM CQ - dosed in mg/24 hours Place 1 patch (21 mg total) onto the skin daily. Start taking on: December 19, 2018   pantoprazole 40 MG tablet Commonly known as: PROTONIX Take 40 mg by mouth daily.   tamsulosin 0.4 MG Caps capsule Commonly known as: FLOMAX Take 0.4 mg by mouth 2 (two) times a day.   venlafaxine XR 150 MG 24 hr capsule Commonly known as: EFFEXOR-XR Take  150 mg by mouth daily with breakfast.       LABORATORY STUDIES CBC    Component Value Date/Time   WBC 8.9 12/18/2018 0917   RBC 5.03 12/18/2018 0917   HGB 15.1 12/18/2018 0917   HCT 45.4 12/18/2018 0917   PLT 214 12/18/2018 0917   MCV 90.3 12/18/2018 0917   MCH 30.0 12/18/2018 0917   MCHC 33.3 12/18/2018 0917   RDW 13.1 12/18/2018 0917   CMP    Component Value Date/Time   NA 138 12/18/2018 0917   K 3.8 12/18/2018 0917   CL 106 12/18/2018 0917   CO2 20 (L) 12/18/2018 0917   GLUCOSE 107 (H) 12/18/2018 0917   BUN 23 12/18/2018 0917   CREATININE 1.29 (H) 12/18/2018 0917   CALCIUM 9.2 12/18/2018 0917   GFRNONAA 54 (L) 12/18/2018 0917   GFRAA >60 12/18/2018 0917   COAGSNo results found for: INR, PROTIME Lipid Panel    Component Value Date/Time   CHOL 136 12/16/2018 0502   TRIG 56 12/16/2018 0502   HDL 49 12/16/2018 0502   CHOLHDL 2.8 12/16/2018 0502   VLDL 11 12/16/2018 0502   LDLCALC 76 12/16/2018 0502   HgbA1C  Lab Results  Component Value Date   HGBA1C 5.0 12/16/2018   Urine Drug Screen     Component Value Date/Time  LABOPIA NONE DETECTED 12/16/2018 1156   COCAINSCRNUR NONE DETECTED 12/16/2018 1156   LABBENZ POSITIVE (A) 12/16/2018 1156   AMPHETMU NONE DETECTED 12/16/2018 1156   THCU NONE DETECTED 12/16/2018 1156   LABBARB NONE DETECTED 12/16/2018 1156     SIGNIFICANT DIAGNOSTIC STUDIES Ct Head Wo Contrast 12/16/2018 1. New 8 mm acute hemorrhage in the right lateral thalamus/posterior limb of internal capsule. 2. No additional findings for large vascular territory infarction, hemorrhage, or mass effect. 3. Stable chronic microvascular ischemic changes and parenchymal volume loss of the brain.   Mr Brain Wo Contrast 12/16/2018 Limited study Acute infarct right lenticular nucleus extending into the corona radiata Small hemorrhage right  thalamus, unchanged from earlier CT.   Ct Head Wo Contrast 12/18/2018 Acute infarction in the right posterior basal  ganglia and external capsule is becoming more conspicuous with low density. No bleeding in that region. 6-7 mm right thalamic hemorrhage is unchanged.  Dg Chest Port 1 View 12/16/2018 Minimal bibasilar atelectasis.   2D echocardiogram  1. The left ventricle has hyperdynamic systolic function, with an ejection fraction of >65%. The cavity size was normal. There is moderately increased left ventricular wall thickness. Left ventricular diastolic Doppler parameters are consistent with  impaired relaxation. No evidence of left ventricular regional wall motion abnormalities.  2. The right ventricle has normal systolic function. The cavity was normal. There is no increase in right ventricular wall thickness.  3. Trivial pericardial effusion is present.  4. No evidence of mitral valve stenosis. No mitral regurgitation noted.  5. The aortic valve is tricuspid. Mild calcification of the aortic valve. No stenosis of the aortic valve.  6. The aortic root is normal in size and structure.  7. Normal IVC size.   HISTORY OF PRESENT ILLNESS Jordan Sawyer is a 73 y.o. male with history of tobacco abuse, migraines, hypertension, COPD.  Patient presented [facility] with weakness since 6:30 in the morning 12/15/2018 (LKW). Patient stated that when he woke up he was feeling normal however when he walked to the kitchen and started feeling that his left leg became weak.  He then felt as if he was having difficulty walking and noted that his upper extremity on the left was also weak.  When talking to his wife she noticed slurred speech and a facial droop.  Code stroke was called and CT was obtained showing no acute bleed.  Patient was at that time evaluated by tele-neurology who recommended giving tPA.  Patient was then transferred to [facility] for CTA and further stroke evaluation and care.  Currently patient still feels as though he has left-sided weakness and slurred speech. Premorbid modified  Rankin scale (mRS): 0. NIH stroke score of 6.  Patient was admitted to the neuro ICU for further evaluation and treatment.   HOSPITAL COURSE Jordan Sawyer is a 73 y.o. male with history of tobacco abuse, migraines, HTN, COPD presenting [facility] with left-sided weakness, slurred speech and facial droop.  [facility] at [facility] on 12/15/2018.  Stroke:  R lenticular/corona radiata infarct s/p tPA with post tPA with HT at R thalamus - infarct secondary to small vessel disease source  CT head [facility] no hemorrhage   CT head 7/1 0146 R thalamic/PLIC ICH. Small vessel disease. Atrophy.   MRI 7/1 [id number] R lenticular nucleus/corona radiata infarct. Small R thalamic hmg unchanges.  CT head 7/3 R posterior basal ganglia/external capsule infarct.  6 to 7 mm R thalamic hemorrhage stable  2D Echo EF > 65%  LDL 76  HgbA1c 5.0  No meds listed prior to admission, not on antithrombotic given hemorrhagic conversion seen on CT. we will hold aspirin at discharge.  Repeat CT in 2 weeks and make decision if to restart aspirin at that time  Therapy recommendations:  CIR -> progressed to home health level therapies  Disposition:   Return home with home health  Agitation and restless, resolved  2PPD for 50 years, put on nicotine patch  Hydrocodone 5/325 tid daily at home - resumed after pass swallow  Off restraints  Close monitoring  Resume home lamictal and effexor  On FA/MVI/B1 in hospital  Hypertension  Home meds: lisinopril 10  Stable  SBP goal < 160 given post tPA HMG during hospitalization  Treated with Cleviprex in ICU, now off   Resumed home lisinopril, increase to 20mg  bid  Long-term BP goal normotensive  Hyperlipidemia  Home meds: mevacor 40  LDL 76, goal < 70  pravastatin 40 in the hospital due to formulary availability  Resume Mevacor on discharge  Dysphagia  Secondary to stroke  Passed swallow after MBS  dys 3 thin  liquid  No follow up needed  Tobacco abuse  Current smoker  Smoking cessation counseling will be provided  Nicotine patch provided   Other Stroke Risk Factors  Advanced age  Migraines  Narcotic user  Other Active Problems  Hypothyroidism on synthroid  COPD  Anxiety on effexor - resume  BPH on flomax - resume  Hypokalemia 3.4 - supplement  DISCHARGE EXAM Blood pressure (!) 131/106, pulse 94, temperature 97.8 F (36.6 C), temperature source Oral, resp. rate (!) 22, height 5\' 10"  (1.778 m), weight 81 kg, SpO2 98 %. General - Well nourished, well developed, not in acute distress.  Ophthalmologic - fundi not visualized due to noncooperation.  Cardiovascular - Regular rhythm, but mild tachycardia.  Mental Status -  Level of arousal and orientation to time, place, and person were intact. Language including expression, naming, repetition, comprehension was assessed and found intact. Moderate dysarthria  Cranial Nerves II - XII - II - Visual field intact OU. III, IV, VI - Extraocular movements intact. V - Facial sensation intact bilaterally. VII - Facial movement intact bilaterally. VIII - Hearing & vestibular intact bilaterally. X - Palate elevates symmetrically. XI - Chin turning & shoulder shrug intact bilaterally. XII - Tongue protrusion intact.  Motor Strength - The patient's strength was normal in all extremities except Left UE pronator drift.  Bulk was normal and fasciculations were absent.   Motor Tone - Muscle tone was assessed at the neck and appendages and was normal.  Reflexes - The patient's reflexes were symmetrical in all extremities and he had no pathological reflexes.  Sensory - Light touch, temperature/pinprick were assessed and were symmetrical.    Coordination - The patient had normal movements in right hands with no ataxia or dysmetria. However, left FTN dysmetria. Tremor was absent.  Gait and Station - deferred.  Discharge  Diet   dysphagia 3 thin liquids  DISCHARGE PLAN  Disposition: Return home  Home health PT, OT  Repeat CT head and 2 weeks   Will decide on resumption of aspirin based on CT results in 2 weeks  Ongoing stroke risk factor control by Primary Care Physician at time of discharge  Follow-up PCP in 2 weeks.  Patient instructed that get one if he does not have.  Follow-up in [facility] in 2 weeks, office to schedule  an appointment.   35 minutes were spent preparing discharge.  Rosalin Hawking, MD PhD Stroke Neurology 12/18/2018 6:52 PM

## 2018-12-18 NOTE — Progress Notes (Signed)
Inpatient Rehabilitation Admissions Coordinator  Inpatient rehab consult received.Patient has progressed well to now recommendations are for home health. Patient is aware and in agreement. We will sign off at this time.  Danne Baxter, RN, MSN Rehab Admissions Coordinator [phone] 12/18/2018 11:25 AM

## 2018-12-18 NOTE — Progress Notes (Addendum)
  Speech Language Pathology Treatment: Dysphagia  Patient Details Name: Jordan Sawyer MRN: [id number] DOB: June 24, 1945 Today's Date: 12/18/2018 Time: 1012-1032 SLP Time Calculation (min) (ACUTE ONLY): 20 min  Assessment / Plan / Recommendation Clinical Impression  Mechanical soft (Dys 3) continues to appear efficient and appropriate. He does not have adequate dentition and ate softer foods prior. No cough, throat clear or wet vocal quality. Reviewed esophageal residue seen during MBS and educated him to possibly eat smaller more frequent meals and remain upright after meals. Recommend continue Dys 3, thin. He may be discharging home soon and recommend he continue chopped meats. Will sign off on swallow goals.    HPI HPI: Pt is a 73 yo male presenting to [facility] with L sided weakness and slurred speech. He received tPA and was transferred to [facility]. CIWA protocol also initiated. Repeat CTH showed a R small thalamic bleed. MRI Acute infarct right lenticular nucleus extending into the corona.       SLP Plan  Continue with current plan of care       Recommendations  Diet recommendations: Dysphagia 3 (mechanical soft);Thin liquid Liquids provided via: Cup;Straw Medication Administration: Whole meds with puree Supervision: Patient able to self feed Compensations: Slow rate;Small sips/bites Postural Changes and/or Swallow Maneuvers: Seated upright 90 degrees;Upright 30-60 min after meal                Oral Care Recommendations: Oral care BID Follow up Recommendations: None SLP Visit Diagnosis: Dysphagia, unspecified (R13.10) Plan: Continue with current plan of care       GO                Houston Siren 12/18/2018, 12:11 PM   Orbie Pyo Colvin Caroli.Ed Risk analyst [phone] Office [phone]

## 2018-12-18 NOTE — Progress Notes (Addendum)
Physical Therapy Treatment Patient Details Name: Jordan Sawyer MRN: [id number] DOB: 10-Jun-1946 Today's Date: 12/18/2018    History of Present Illness Pt is a 73 y.o. M who presents with L sided weakness and slurred speech, received tPA. MRI showing acute infarct right lenticular nucleus extending into corona radiata, small hemorrhage right thalamus.     PT Comments    Pt progressing well towards physical therapy goals. Improved stability with use of walker, ambulating 250 feet with min guard assist. Continues with noted gait abnormalities, balance impairments, and decreased awareness. Recommended pt use walker and have supervision for all mobility; pt verbalized understanding. Pt wanting to go home, updated d/c plan to HHPT in light of progress to maximize functional independence.     Follow Up Recommendations  Supervision for mobility/OOB;Home health PT     Equipment Recommendations  Rolling walker with 5" wheels    Recommendations for Other Services       Precautions / Restrictions Precautions Precautions: Fall Restrictions Weight Bearing Restrictions: No    Mobility  Bed Mobility               General bed mobility comments: OOB in chair  Transfers Overall transfer level: Needs assistance Equipment used: None Transfers: Sit to/from Stand Sit to Stand: Min guard            Ambulation/Gait Ambulation/Gait assistance: Min guard Gait Distance (Feet): 250 Feet Assistive device: Rolling walker (2 wheeled) Gait Pattern/deviations: Step-through pattern;Decreased stance time - left;Decreased step length - left;Decreased weight shift to left Gait velocity: decreased   General Gait Details: Cues for walker proximity, upright posture. Mild knee instability and decreased left step length.   Stairs             Wheelchair Mobility    Modified Rankin (Stroke Patients Only) Modified Rankin (Stroke Patients Only) Pre-Morbid Rankin Score: No significant  disability Modified Rankin: Moderately severe disability     Balance Overall balance assessment: Needs assistance;History of Falls Sitting-balance support: Feet supported Sitting balance-Leahy Scale: Good     Standing balance support: No upper extremity supported;During functional activity Standing balance-Leahy Scale: Fair Standing balance comment: Fair statically                            Cognition Arousal/Alertness: Awake/alert Behavior During Therapy: WFL for tasks assessed/performed Overall Cognitive Status: Impaired/Different from baseline Area of Impairment: Safety/judgement;Awareness                         Safety/Judgement: Decreased awareness of safety;Decreased awareness of deficits Awareness: Emergent          Exercises      General Comments        Pertinent Vitals/Pain Pain Assessment: Faces Faces Pain Scale: Hurts a little bit Pain Location: chronic back pain Pain Descriptors / Indicators: Constant Pain Intervention(s): Monitored during session    Home Living                      Prior Function            PT Goals (current goals can now be found in the care plan section) Acute Rehab PT Goals Patient Stated Goal: "go home." PT Goal Formulation: With patient Time For Goal Achievement: 12/31/18 Potential to Achieve Goals: Good Progress towards PT goals: Progressing toward goals    Frequency    Min 4X/week      PT  Plan Current plan remains appropriate    Co-evaluation              AM-PAC PT "6 Clicks" Mobility   Outcome Measure  Help needed turning from your back to your side while in a flat bed without using bedrails?: None Help needed moving from lying on your back to sitting on the side of a flat bed without using bedrails?: None Help needed moving to and from a bed to a chair (including a wheelchair)?: A Little Help needed standing up from a chair using your arms (e.g., wheelchair or bedside  chair)?: A Little Help needed to walk in hospital room?: A Lot Help needed climbing 3-5 steps with a railing? : A Lot 6 Click Score: 18    End of Session Equipment Utilized During Treatment: Gait belt Activity Tolerance: Patient tolerated treatment well Patient left: in chair;with call bell/phone within reach;with chair alarm set Nurse Communication: Mobility status PT Visit Diagnosis: Unsteadiness on feet (R26.81);History of falling (Z91.81);Other symptoms and signs involving the nervous system (Z61.096(R29.898)     Time: [id number] PT Time Calculation (min) (ACUTE ONLY): 21 min  Charges:  $Gait Training: 8-22 mins                     Laurina Bustlearoline Sussie Minor, [place]PT, DPT Acute Rehabilitation Services Pager [phone] Office [phone]    Vanetta MuldersCarloine H Clemence Stillings 12/18/2018, 9:15 AM

## 2018-12-18 NOTE — TOC Transition Note (Signed)
Transition of Care [facility]) - CM/SW Discharge Note   Patient Details  Name: Jordan Sawyer MRN: [id number] Date of Birth: [birth year]  Transition of Care [facility]) CM/SW Contact:  Ella Bodo, RN Phone Number: 12/18/2018, 4:40 PM   Clinical Narrative:  Pt is a 73 y.o. M who presents with L sided weakness and slurred speech, received tPA. MRI showing acute infarct right lenticular nucleus extending into corona radiata, small hemorrhage right thalamus.  PTA, pt independent, lives with spouse.  He states multiple family members are able to assist at dc.  PT recommending [place] follow up, and pt agreeable to services.  Referral to [facility] for follow up; pt denies any DME needs for home.      Final next level of care: Home w Home Health Services Barriers to Discharge: Barriers Resolved   Patient Goals and CMS Choice   CMS Medicare.gov Compare Post Acute Care list provided to:: Patient Choice offered to / list presented to : Patient                      Discharge Plan and Services   Discharge Planning Services: CM Consult Post Acute Care Choice: Home Health                    HH Arranged: PT [facility] Agency: [facility] Date [facility] Agency Contacted: 12/18/18 Time [place]: [phone] Representative spoke with at [place]: Glyn Ade    Readmission Risk Interventions Readmission Risk Prevention Plan 12/18/2018  Post [place] Appt Not Complete  Appt Comments Office closed on 7/3  Medication Screening Complete  Transportation Screening Complete  Some recent data might be hidden   Reinaldo Raddle, RN, BSN  Trauma/Neuro ICU Case Manager [phone]

## 2018-12-22 ENCOUNTER — Other Ambulatory Visit: Payer: Self-pay

## 2018-12-22 NOTE — Patient Outreach (Signed)
[facility]) Care Management  12/22/2018  Jordan Sawyer [birth year] [id number]   EMMI- Stroke RED ON EMMI ALERT Day # 1 Date: 12/21/2018 Red Alert Reason:  Feeling worse overall? Yes  Scheduled a follow-up appointment? No  Filled new prescriptions? No  Been able to take every dose of meds? No    Outreach attempt: spoke with patient.  He is able to verify HIPAA.  Patient states that he is doing good and has scheduled follow up with his physician.  Patient states that wife and daughter are involved in his care and makes sure he has help and medications.  Addressed red alerts with patient.  He states that he feels fine and has all his medications and has not missed a dose.  Home health he states is due to his home shortly.  He declines any further questions or concerns.    Plan: RN CM will close case.    Jone Baseman, RN, MSN [facility] Care Management Care Management Coordinator Direct Line [phone] Toll Free: [phone]  Fax: [phone]

## 2019-01-14 ENCOUNTER — Other Ambulatory Visit: Payer: Self-pay

## 2019-01-14 ENCOUNTER — Ambulatory Visit (INDEPENDENT_AMBULATORY_CARE_PROVIDER_SITE_OTHER): Payer: Medicare PPO | Admitting: Neurology

## 2019-01-14 ENCOUNTER — Encounter: Payer: Self-pay | Admitting: Neurology

## 2019-01-14 VITALS — BP 135/82 | HR 88 | Temp 98.2°F

## 2019-01-14 DIAGNOSIS — I6381 Other cerebral infarction due to occlusion or stenosis of small artery: Secondary | ICD-10-CM | POA: Diagnosis not present

## 2019-01-14 DIAGNOSIS — I610 Nontraumatic intracerebral hemorrhage in hemisphere, subcortical: Secondary | ICD-10-CM | POA: Diagnosis not present

## 2019-01-14 MED ORDER — ATORVASTATIN CALCIUM 20 MG PO TABS: 20.0000 mg | ORAL_TABLET | Freq: Every day | ORAL | 11 refills | Status: AC

## 2019-01-14 MED ORDER — ASPIRIN EC 81 MG PO TBEC: 81.0000 mg | DELAYED_RELEASE_TABLET | Freq: Every day | ORAL | 2 refills | Status: AC

## 2019-01-14 NOTE — Patient Instructions (Signed)
I had a long d/w patient and his daughter about his recent  Lacunar stroke, post tps hemorrhage,risk for recurrent stroke/TIAs, personally independently reviewed imaging studies and stroke evaluation results and answered questions.Start aspirin 81 mg daily  for secondary stroke prevention and maintain strict control of hypertension with blood pressure goal below 130/90, diabetes with hemoglobin A1c goal below 6.5% and lipids with LDL cholesterol goal below 70 mg/dL.  I recommend he start taking Lipitor 20 mg daily .I also advised the patient to eat a healthy diet with plenty of whole grains, cereals, fruits and vegetables, exercise regularly and maintain ideal body weight .I have strongly encouraged him to try to quit smoking and seek help from his primary care physician for the same.  Check follow-up CT angiogram of the brain and neck as they were not done during the hospitalization due to post TPA hemorrhage.  Followup in the future with my nurse practitioner Shanda BumpsJessica in 2 months or call earlier if necessary.  Stroke Prevention Some medical conditions and behaviors are associated with a higher chance of having a stroke. You can help prevent a stroke by making nutrition, lifestyle, and other changes, including managing any medical conditions you may have. What nutrition changes can be made?   Eat healthy foods. You can do this by: ? Choosing foods high in fiber, such as fresh fruits and vegetables and whole grains. ? Eating at least 5 or more servings of fruits and vegetables a day. Try to fill half of your plate at each meal with fruits and vegetables. ? Choosing lean protein foods, such as lean cuts of meat, poultry without skin, fish, tofu, beans, and nuts. ? Eating low-fat dairy products. ? Avoiding foods that are high in salt (sodium). This can help lower blood pressure. ? Avoiding foods that have saturated fat, trans fat, and cholesterol. This can help prevent high cholesterol. ? Avoiding  processed and premade foods.  Follow your health care provider's specific guidelines for losing weight, controlling high blood pressure (hypertension), lowering high cholesterol, and managing diabetes. These may include: ? Reducing your daily calorie intake. ? Limiting your daily sodium intake to 1,500 milligrams (mg). ? Using only healthy fats for cooking, such as olive oil, canola oil, or sunflower oil. ? Counting your daily carbohydrate intake. What lifestyle changes can be made?  Maintain a healthy weight. Talk to your health care provider about your ideal weight.  Get at least 30 minutes of moderate physical activity at least 5 days a week. Moderate activity includes brisk walking, biking, and swimming.  Do not use any products that contain nicotine or tobacco, such as cigarettes and e-cigarettes. If you need help quitting, ask your health care provider. It may also be helpful to avoid exposure to secondhand smoke.  Limit alcohol intake to no more than 1 drink a day for nonpregnant women and 2 drinks a day for men. One drink equals 12 oz of beer, 5 oz of wine, or 1 oz of hard liquor.  Stop any illegal drug use.  Avoid taking birth control pills. Talk to your health care provider about the risks of taking birth control pills if: ? You are over 75 years old. ? You smoke. ? You get migraines. ? You have ever had a blood clot. What other changes can be made?  Manage your cholesterol levels. ? Eating a healthy diet is important for preventing high cholesterol. If cholesterol cannot be managed through diet alone, you may also need to take medicines. ?  Take any prescribed medicines to control your cholesterol as told by your health care provider.  Manage your diabetes. ? Eating a healthy diet and exercising regularly are important parts of managing your blood sugar. If your blood sugar cannot be managed through diet and exercise, you may need to take medicines. ? Take any prescribed  medicines to control your diabetes as told by your health care provider.  Control your hypertension. ? To reduce your risk of stroke, try to keep your blood pressure below 130/80. ? Eating a healthy diet and exercising regularly are an important part of controlling your blood pressure. If your blood pressure cannot be managed through diet and exercise, you may need to take medicines. ? Take any prescribed medicines to control hypertension as told by your health care provider. ? Ask your health care provider if you should monitor your blood pressure at home. ? Have your blood pressure checked every year, even if your blood pressure is normal. Blood pressure increases with age and some medical conditions.  Get evaluated for sleep disorders (sleep apnea). Talk to your health care provider about getting a sleep evaluation if you snore a lot or have excessive sleepiness.  Take over-the-counter and prescription medicines only as told by your health care provider. Aspirin or blood thinners (antiplatelets or anticoagulants) may be recommended to reduce your risk of forming blood clots that can lead to stroke.  Make sure that any other medical conditions you have, such as atrial fibrillation or atherosclerosis, are managed. What are the warning signs of a stroke? The warning signs of a stroke can be easily remembered as BEFAST.  B is for balance. Signs include: ? Dizziness. ? Loss of balance or coordination. ? Sudden trouble walking.  E is for eyes. Signs include: ? A sudden change in vision. ? Trouble seeing.  F is for face. Signs include: ? Sudden weakness or numbness of the face. ? The face or eyelid drooping to one side.  A is for arms. Signs include: ? Sudden weakness or numbness of the arm, usually on one side of the body.  S is for speech. Signs include: ? Trouble speaking (aphasia). ? Trouble understanding.  T is for time. ? These symptoms may represent a serious problem that is  an emergency. Do not wait to see if the symptoms will go away. Get medical help right away. Call your local emergency services (911 in the [place] Do not drive yourself to the hospital.  Other signs of stroke may include: ? A sudden, severe headache with no known cause. ? Nausea or vomiting. ? Seizure. Where to find more information For more information, visit:  American Stroke Association: www.strokeassociation.org  National Stroke Association: www.stroke.org Summary  You can prevent a stroke by eating healthy, exercising, not smoking, limiting alcohol intake, and managing any medical conditions you may have.  Do not use any products that contain nicotine or tobacco, such as cigarettes and e-cigarettes. If you need help quitting, ask your health care provider. It may also be helpful to avoid exposure to secondhand smoke.  Remember BEFAST for warning signs of stroke. Get help right away if you or a loved one has any of these signs. This information is not intended to replace advice given to you by your health care provider. Make sure you discuss any questions you have with your health care provider. Document Released: 07/11/2004 Document Revised: 05/16/2017 Document Reviewed: 07/09/2016 Elsevier Patient Education  2020 [employer].

## 2019-01-15 NOTE — Progress Notes (Signed)
[facility] [address] [place]. [place] [address] [phone]       OFFICE CONSULT NOTE  [name] Date of Birth:  10/26/1945 Medical Record Number:  [id number]   Referring MD: Marvel PlanJindong Xu  Reason for Referral: Stroke  HPI: [name] is a 73 year old male seen today for initial office consultation visit for stroke.  History is obtained from the patient and his daughter as well as review of electronic medical records.  I personally reviewed imaging films in PACS.Jordan ResidesMelvin C Sawyer a 72 y.o.malewith history of tobacco abuse, migraines, hypertension, COPD. Patient [facility] with weakness since 6:30 in the morning 12/15/2018 (LKW).Patient stated that when he woke up he was feeling normal however when he walked to the kitchen and started feeling that his left leg became weak. He then felt as if he was having difficulty walking and noted that his upper extremity on the left was also weak. When talking to his wife she noticed slurred speech and a facial droop. Code stroke was called and CT was obtained showing no acute bleed. Patient was at that time evaluated by tele-neurology who recommended giving tPA. Patient was then transferred [facility] for CTA and further stroke evaluation and care. Currently patient still feels as though he has left-sided weakness and slurred speech. Premorbid modified Rankin scale (mRS):0. NIH stroke score of 6.  Patient was admitted to the neuro ICU for further evaluation and treatment.  His blood pressure was tightly controlled but he had sudden neurological changes with worsening deficits and a CT scan was obtained which showed a small right 6 to 7 mm thalamic post TPA hemorrhage without cytotoxic edema or intraventricular extension.  Subsequent follow-up CT scan showed stable appearance of the hemorrhage.  2D echo showed normal ejection fraction without cardiac source of embolism.  LDL cholesterol  was 76 mg percent.  Hemoglobin A1c was 5.0.  Patient was on no antithrombotics prior to admission and these were also held initially due to his post TPA hemorrhage but at the time of discharge he was advised to take aspirin.  He was transferred to inpatient rehab initially but on the day of transfer he had improved significantly that plan was changed to send him home with home health.  Patient had some agitation and restlessness it was felt to be related to nicotine withdrawal is put on a nicotine patch.  He had some initial dysphagia but subsequently improved and was put on dysphagia diet.  At the time of discharge he was improving with only dysarthria and some left upper extremity drift.  He states is done well since discharge.  He still getting home physical occupational therapy.  He is able to walk with a cane (states he does not need it for short distances.  He has had no falls but does feel wobbly at times because of his left leg which tracks.  He has had no major injuries.  Patient has had some flareup of his depression and agitation.  His primary physician has increased dose of Effexor recently.  Complains of mild headaches but is otherwise doing well.  He has not been taking aspirin.  Review of hospital stroke work-up revealed that he never had intra-or extracranial vascular imaging done for some reason.  He has quit smoking completely.  ROS:   14 system review of systems is positive for gait difficulty, leg weakness, headache, agitation, depression and all other systems negative  PMH:  Past Medical History:  Diagnosis Date   Anxiety    COPD (chronic obstructive pulmonary disease) (HCC)    Hypertension    Hypothyroidism    Kidney stones    Migraine    Stroke (HCC)    Tobacco abuse     Social History:  Social History   Socioeconomic History   Marital status: Married    Spouse name: Not on file   Number of children: Not on file   Years of education: Not on file   Highest  education level: Not on file  Occupational History   Not on file  Social Needs   Financial resource strain: Not on file   Food insecurity    Worry: Not on file    Inability: Not on file   Transportation needs    Medical: Not on file    Non-medical: Not on file  Tobacco Use   Smoking status: Current Every Day Smoker    Packs/day: 3.00   Smokeless tobacco: Never Used  Substance and Sexual Activity   Alcohol use: Not Currently   Drug use: Not Currently   Sexual activity: Not on file  Lifestyle   Physical activity    Days per week: Not on file    Minutes per session: Not on file   Stress: Not on file  Relationships   Social connections    Talks on phone: Not on file    Gets together: Not on file    Attends religious service: Not on file    Active member of club or organization: Not on file    Attends meetings of clubs or organizations: Not on file    Relationship status: Not on file   Intimate partner violence    Fear of current or ex partner: Not on file    Emotionally abused: Not on file    Physically abused: Not on file    Forced sexual activity: Not on file  Other Topics Concern   Not on file  Social History Narrative   Not on file    Medications:   Current Outpatient Medications on File Prior to Visit  Medication Sig Dispense Refill   lamoTRIgine (LAMICTAL) 200 MG tablet Take 200 mg by mouth daily.     lisinopril (ZESTRIL) 20 MG tablet Take 1 tablet (20 mg total) by mouth 2 (two) times a day. 30 tablet 2   nicotine (NICODERM CQ - DOSED IN MG/24 HOURS) 21 mg/24hr patch Place 1 patch (21 mg total) onto the skin daily. 28 patch 0   pantoprazole (PROTONIX) 40 MG tablet Take 40 mg by mouth daily.     venlafaxine XR (EFFEXOR-XR) 150 MG 24 hr capsule Take 150 mg by mouth daily with breakfast.     No current facility-administered medications on file prior to visit.     Allergies:  Not on File  Physical Exam General: well developed, well  nourished, seated, in no evident distress Head: head normocephalic and atraumatic.   Neck: supple with no carotid or supraclavicular bruits Cardiovascular: regular rate and rhythm, no murmurs Musculoskeletal: no deformity Skin:  no rash/petichiae Vascular:  Normal pulses all extremities  Neurologic Exam Mental Status: Awake and fully alert. Oriented to place and time. Recent and remote memory intact. Attention span, concentration and fund of knowledge appropriate. Mood and affect appropriate.  Cranial Nerves: Fundoscopic exam reveals sharp disc margins. Pupils equal, briskly reactive to light. Extraocular movements full without nystagmus. Visual fields full to confrontation. Hearing intact. Facial sensation intact.  Mild left lower facial  asymmetry., tongue, palate moves normally and symmetrically.  Motor: Normal bulk and tone. Normal strength in all tested extremity muscles. Sensory.: intact to touch , pinprick , mild left grip weakness.  Mild left hip flexor and ankle dorsiflexor weakness.  Diminished fine finger movements on the left.  Orbits right over left upper extremity.  Slight increase in tone in the left side.  Position and vibratory sensation.  Coordination: Rapid alternating movements slower on the left compared to the right.  Finger-to-nose and heel-to-shin slowly on the left compared to the right. Gait and Station: Arises from chair without difficulty. Stance is normal. Gait demonstrates dragging of the left leg.  Able to heel, toe and tandem walk with mild difficulty.  Reflexes: 1+ and asymmetric and slightly brisker on the left. Toes downgoing.   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  Vascular risk factors of smoking, hypertension and hyperlipidemia     PLAN: I had a long d/w patient and his daughter about  his recent  lacunar stroke, post tpa hemorrhage,risk for recurrent stroke/TIAs, personally independently reviewed imaging studies and stroke evaluation results and answered questions.Start aspirin 81 mg daily  for secondary stroke prevention and maintain strict control of hypertension with blood pressure goal below 130/90, diabetes with hemoglobin A1c goal below 6.5% and lipids with LDL cholesterol goal below 70 mg/dL.  I recommend he start taking Lipitor 20 mg daily .I also advised the patient to eat a healthy diet with plenty of whole grains, cereals, fruits and vegetables, exercise regularly and maintain ideal body weight .I have strongly encouraged him to try to quit smoking and seek help from his primary care physician for the same.  Check follow-up CT angiogram of the brain and neck as they were not done during the hospitalization due to post TPA hemorrhage.  Followup in the future with my nurse practitioner Shanda BumpsJessica in 2 months or call earlier if necessary. Delia HeadyPramod Janyce Ellinger, MD  [facility] [address] [place], [place] [id number]  Phone [phone] Fax [phone] Note: This document was prepared with digital dictation and possible smart phrase technology. Any transcriptional errors that result from this process are unintentional.

## 2019-01-18 ENCOUNTER — Telehealth: Payer: Self-pay | Admitting: Neurology

## 2019-01-18 NOTE — Telephone Encounter (Signed)
Jordan Sawyer: [id number] (exp. 01/18/19 to 02/17/19) order sent to GI. They will reach out to the patient to schedule.

## 2019-01-19 NOTE — Telephone Encounter (Signed)
pt is scheduled at [facility] for 01/25/19 arrival time is 8:30 AM pt daughter Jeannene Patella is aware. I also gave her their number of [phone] incase she needed to r/s.

## 2019-01-19 NOTE — Telephone Encounter (Signed)
The time had to be changed to 12:30 pm patient daughter is aware.

## 2019-01-21 NOTE — Telephone Encounter (Signed)
Because Dr. Leonie Man was not in the office on 01/19/19 due to him working in the hospital. Dr. Krista Blue was the work in Doctor signed the [place] orders. [facility] just called me and told me they cannot except it since it was not Dr. Leonie Man signature even though I told them it was the work in doctor. His appointment is not until Monday 8//10/20 at 12:30 pm When you come into the office on Monday 01/25/19 please sign the CT orders so I can fax them to [facility].

## 2019-01-25 NOTE — Telephone Encounter (Signed)
Orders sign and given to [facility] by Dr. Leonie Man.

## 2019-02-01 ENCOUNTER — Other Ambulatory Visit: Payer: Medicare PPO

## 2019-02-17 ENCOUNTER — Telehealth: Payer: Self-pay | Admitting: Neurology

## 2019-02-17 ENCOUNTER — Other Ambulatory Visit: Payer: Self-pay

## 2019-02-17 NOTE — Telephone Encounter (Signed)
Tara @ [facility] is asking for a call from RN to discuss she is being told that a PA is needed for the atorvastatin because of mail order of lovastartin  Was delivered.  Baxter Flattery said pt is not clear on which of the two he should be taking.  Baxter Flattery can be reached at [phone]

## 2019-02-18 NOTE — Telephone Encounter (Signed)
Received approval of Atorvastatin from [employer]. Approved through 06/17/2019. I faxed [facility] a copy. Received a receipt of confirmation.

## 2019-02-18 NOTE — Telephone Encounter (Signed)
thanks

## 2019-02-18 NOTE — Telephone Encounter (Signed)
I spoke with Dineen Kid, pt's daughter (on [place]). We discussed the pt is now supposed to be only be on Atorvastatin 20 mg daily. She stated she put up the Lovastatin. I advised the Lovastatin had been canceled at [facility] and a PA is pending for the Atorvastatin. She verbalized appreciation and her questions were answered. I suggested she contact pharmacy for proper medication disposal advice. Encouraged a call back for any further questions.

## 2019-02-18 NOTE — Telephone Encounter (Addendum)
Atorvastatin 20 mg PA completed on CMM. Awaiting [employer] determination.   Key: [id number] - PA Case ID: [id number] - Rx #: [id number]  I spoke with Amie Critchley, pharmacist, at [facility] mail order pharmacy and I canceled the Lovastatin per Dr. Leonie Man and advised pt will now be on Atorvastatin. Mark verbalized understanding and appreciation.

## 2019-02-18 NOTE — Telephone Encounter (Signed)
Per office visit with Dr. Leonie Man on 01/14/2019, Lovastatin was discontinued and Atorvastatin 20 mg was started. I spoke with the pharmacy. I was told the pt paid cash for the Atorvastatin yesterday. She believes the pt had a 90 day supply mailed to the pt from [facility], so a PA will be needed for those upcoming refills of the medication.

## 2019-03-25 ENCOUNTER — Other Ambulatory Visit: Payer: Self-pay

## 2019-03-25 NOTE — Patient Outreach (Signed)
First attempt to obtain mRs. Patient was asleep, asked to call back another time.

## 2019-03-30 ENCOUNTER — Other Ambulatory Visit: Payer: Self-pay

## 2019-03-30 NOTE — Patient Outreach (Signed)
Second attempt to obtain mRs. Patient asleep. Will try again at a different time of day.

## 2019-03-30 NOTE — Telephone Encounter (Signed)
This encounter was created in error - please disregard.

## 2019-04-01 ENCOUNTER — Other Ambulatory Visit: Payer: Self-pay

## 2019-04-01 NOTE — Patient Outreach (Signed)
3 outreach attempts were completed to obtain mRs. mRs could not be obtained because patient never returned my calls. mRs=7 

## 2019-04-19 ENCOUNTER — Ambulatory Visit: Payer: Medicare PPO | Admitting: Adult Health

## 2019-06-19 ENCOUNTER — Emergency Department (HOSPITAL_COMMUNITY): Payer: Medicare PPO

## 2019-06-19 ENCOUNTER — Inpatient Hospital Stay (HOSPITAL_COMMUNITY): Payer: Medicare PPO

## 2019-06-19 ENCOUNTER — Other Ambulatory Visit: Payer: Self-pay

## 2019-06-19 ENCOUNTER — Inpatient Hospital Stay (HOSPITAL_COMMUNITY)
Admission: EM | Admit: 2019-06-19 | Discharge: 2019-07-07 | DRG: 056 | Disposition: A | Discharge status: Home Health | Payer: Medicare PPO | Attending: Internal Medicine | Admitting: Internal Medicine

## 2019-06-19 ENCOUNTER — Encounter (HOSPITAL_COMMUNITY): Payer: Self-pay | Admitting: Emergency Medicine

## 2019-06-19 DIAGNOSIS — R0902 Hypoxemia: Secondary | ICD-10-CM

## 2019-06-19 DIAGNOSIS — Z7181 Spiritual or religious counseling: Secondary | ICD-10-CM

## 2019-06-19 DIAGNOSIS — G92 Toxic encephalopathy: Secondary | ICD-10-CM | POA: Diagnosis present

## 2019-06-19 DIAGNOSIS — G249 Dystonia, unspecified: Secondary | ICD-10-CM | POA: Diagnosis present

## 2019-06-19 DIAGNOSIS — F0390 Unspecified dementia without behavioral disturbance: Secondary | ICD-10-CM | POA: Diagnosis not present

## 2019-06-19 DIAGNOSIS — E039 Hypothyroidism, unspecified: Secondary | ICD-10-CM | POA: Diagnosis present

## 2019-06-19 DIAGNOSIS — Z7189 Other specified counseling: Secondary | ICD-10-CM

## 2019-06-19 DIAGNOSIS — W1830XA Fall on same level, unspecified, initial encounter: Secondary | ICD-10-CM | POA: Diagnosis present

## 2019-06-19 DIAGNOSIS — G4700 Insomnia, unspecified: Secondary | ICD-10-CM | POA: Diagnosis present

## 2019-06-19 DIAGNOSIS — G253 Myoclonus: Secondary | ICD-10-CM | POA: Diagnosis present

## 2019-06-19 DIAGNOSIS — E876 Hypokalemia: Secondary | ICD-10-CM | POA: Diagnosis present

## 2019-06-19 DIAGNOSIS — K219 Gastro-esophageal reflux disease without esophagitis: Secondary | ICD-10-CM | POA: Diagnosis present

## 2019-06-19 DIAGNOSIS — I6930 Unspecified sequelae of cerebral infarction: Secondary | ICD-10-CM

## 2019-06-19 DIAGNOSIS — Z79899 Other long term (current) drug therapy: Secondary | ICD-10-CM

## 2019-06-19 DIAGNOSIS — I10 Essential (primary) hypertension: Secondary | ICD-10-CM | POA: Diagnosis present

## 2019-06-19 DIAGNOSIS — R059 Cough, unspecified: Secondary | ICD-10-CM

## 2019-06-19 DIAGNOSIS — B961 Klebsiella pneumoniae [K. pneumoniae] as the cause of diseases classified elsewhere: Secondary | ICD-10-CM | POA: Diagnosis present

## 2019-06-19 DIAGNOSIS — R1319 Other dysphagia: Secondary | ICD-10-CM | POA: Diagnosis present

## 2019-06-19 DIAGNOSIS — S2232XD Fracture of one rib, left side, subsequent encounter for fracture with routine healing: Secondary | ICD-10-CM | POA: Diagnosis not present

## 2019-06-19 DIAGNOSIS — R451 Restlessness and agitation: Secondary | ICD-10-CM | POA: Diagnosis not present

## 2019-06-19 DIAGNOSIS — Z79890 Hormone replacement therapy (postmenopausal): Secondary | ICD-10-CM

## 2019-06-19 DIAGNOSIS — K117 Disturbances of salivary secretion: Secondary | ICD-10-CM | POA: Diagnosis not present

## 2019-06-19 DIAGNOSIS — Z720 Tobacco use: Secondary | ICD-10-CM | POA: Diagnosis not present

## 2019-06-19 DIAGNOSIS — R471 Dysarthria and anarthria: Secondary | ICD-10-CM | POA: Diagnosis present

## 2019-06-19 DIAGNOSIS — I700 Atherosclerosis of aorta: Secondary | ICD-10-CM | POA: Diagnosis present

## 2019-06-19 DIAGNOSIS — E785 Hyperlipidemia, unspecified: Secondary | ICD-10-CM | POA: Diagnosis present

## 2019-06-19 DIAGNOSIS — Z8673 Personal history of transient ischemic attack (TIA), and cerebral infarction without residual deficits: Secondary | ICD-10-CM | POA: Diagnosis not present

## 2019-06-19 DIAGNOSIS — R627 Adult failure to thrive: Secondary | ICD-10-CM | POA: Diagnosis present

## 2019-06-19 DIAGNOSIS — R05 Cough: Secondary | ICD-10-CM | POA: Diagnosis present

## 2019-06-19 DIAGNOSIS — M6281 Muscle weakness (generalized): Secondary | ICD-10-CM

## 2019-06-19 DIAGNOSIS — Z20822 Contact with and (suspected) exposure to covid-19: Secondary | ICD-10-CM | POA: Diagnosis present

## 2019-06-19 DIAGNOSIS — N390 Urinary tract infection, site not specified: Secondary | ICD-10-CM | POA: Diagnosis present

## 2019-06-19 DIAGNOSIS — I69398 Other sequelae of cerebral infarction: Secondary | ICD-10-CM | POA: Diagnosis not present

## 2019-06-19 DIAGNOSIS — Z515 Encounter for palliative care: Secondary | ICD-10-CM

## 2019-06-19 DIAGNOSIS — Z781 Physical restraint status: Secondary | ICD-10-CM

## 2019-06-19 DIAGNOSIS — F17210 Nicotine dependence, cigarettes, uncomplicated: Secondary | ICD-10-CM | POA: Diagnosis present

## 2019-06-19 DIAGNOSIS — R296 Repeated falls: Secondary | ICD-10-CM | POA: Diagnosis present

## 2019-06-19 DIAGNOSIS — S2232XA Fracture of one rib, left side, initial encounter for closed fracture: Secondary | ICD-10-CM | POA: Diagnosis present

## 2019-06-19 DIAGNOSIS — R410 Disorientation, unspecified: Secondary | ICD-10-CM

## 2019-06-19 DIAGNOSIS — J449 Chronic obstructive pulmonary disease, unspecified: Secondary | ICD-10-CM | POA: Diagnosis present

## 2019-06-19 DIAGNOSIS — F0391 Unspecified dementia with behavioral disturbance: Secondary | ICD-10-CM | POA: Diagnosis not present

## 2019-06-19 DIAGNOSIS — R4182 Altered mental status, unspecified: Secondary | ICD-10-CM | POA: Diagnosis not present

## 2019-06-19 DIAGNOSIS — Z9181 History of falling: Secondary | ICD-10-CM

## 2019-06-19 DIAGNOSIS — R441 Visual hallucinations: Secondary | ICD-10-CM | POA: Diagnosis present

## 2019-06-19 DIAGNOSIS — R0681 Apnea, not elsewhere classified: Secondary | ICD-10-CM | POA: Diagnosis not present

## 2019-06-19 DIAGNOSIS — Z7982 Long term (current) use of aspirin: Secondary | ICD-10-CM

## 2019-06-19 DIAGNOSIS — K5900 Constipation, unspecified: Secondary | ICD-10-CM

## 2019-06-19 DIAGNOSIS — F419 Anxiety disorder, unspecified: Secondary | ICD-10-CM | POA: Diagnosis present

## 2019-06-19 DIAGNOSIS — D72829 Elevated white blood cell count, unspecified: Secondary | ICD-10-CM | POA: Diagnosis not present

## 2019-06-19 DIAGNOSIS — I69344 Monoplegia of lower limb following cerebral infarction affecting left non-dominant side: Secondary | ICD-10-CM | POA: Diagnosis not present

## 2019-06-19 DIAGNOSIS — N400 Enlarged prostate without lower urinary tract symptoms: Secondary | ICD-10-CM | POA: Diagnosis present

## 2019-06-19 DIAGNOSIS — R4189 Other symptoms and signs involving cognitive functions and awareness: Secondary | ICD-10-CM

## 2019-06-19 DIAGNOSIS — Z8249 Family history of ischemic heart disease and other diseases of the circulatory system: Secondary | ICD-10-CM

## 2019-06-19 DIAGNOSIS — W19XXXA Unspecified fall, initial encounter: Secondary | ICD-10-CM | POA: Diagnosis not present

## 2019-06-19 DIAGNOSIS — Z7401 Bed confinement status: Secondary | ICD-10-CM

## 2019-06-19 DIAGNOSIS — W19XXXD Unspecified fall, subsequent encounter: Secondary | ICD-10-CM | POA: Diagnosis not present

## 2019-06-19 DIAGNOSIS — R531 Weakness: Secondary | ICD-10-CM

## 2019-06-19 HISTORY — DX: Unspecified dementia, unspecified severity, without behavioral disturbance, psychotic disturbance, mood disturbance, and anxiety: F03.90

## 2019-06-19 LAB — BASIC METABOLIC PANEL
Anion gap: 11 (ref 5–15)
BUN: 15 mg/dL (ref 8–23)
CO2: 23 mmol/L (ref 22–32)
Calcium: 8.6 mg/dL — ABNORMAL LOW (ref 8.9–10.3)
Chloride: 101 mmol/L (ref 98–111)
Creatinine, Ser: 1.23 mg/dL (ref 0.61–1.24)
GFR calc Af Amer: >60 mL/min (ref >60)
GFR calc non Af Amer: 57 mL/min — ABNORMAL LOW (ref >60)
Glucose, Bld: 99 mg/dL (ref 70–99)
Potassium: 3.4 mmol/L — ABNORMAL LOW (ref 3.5–5.1)
Sodium: 135 mmol/L (ref 135–145)

## 2019-06-19 LAB — CBC
HCT: 37.9 % — ABNORMAL LOW (ref 39.0–52.0)
Hemoglobin: 12.5 g/dL — ABNORMAL LOW (ref 13.0–17.0)
MCH: 30 pg (ref 26.0–34.0)
MCHC: 33 g/dL (ref 30.0–36.0)
MCV: 90.9 fL (ref 80.0–100.0)
Platelets: 285 10*3/uL (ref 150–400)
RBC: 4.17 MIL/uL — ABNORMAL LOW (ref 4.22–5.81)
RDW: 13 % (ref 11.5–15.5)
WBC: 8.6 10*3/uL (ref 4.0–10.5)
nRBC: 0 % (ref 0.0–0.2)

## 2019-06-19 LAB — HEPATIC FUNCTION PANEL
ALT: 16 U/L (ref 0–44)
AST: 24 U/L (ref 15–41)
Albumin: 3.2 g/dL — ABNORMAL LOW (ref 3.5–5.0)
Alkaline Phosphatase: 115 U/L (ref 38–126)
Bilirubin, Direct: 0.3 mg/dL — ABNORMAL HIGH (ref 0.0–0.2)
Indirect Bilirubin: 1.1 mg/dL — ABNORMAL HIGH (ref 0.3–0.9)
Total Bilirubin: 1.4 mg/dL — ABNORMAL HIGH (ref 0.3–1.2)
Total Protein: 6.4 g/dL — ABNORMAL LOW (ref 6.5–8.1)

## 2019-06-19 LAB — URINALYSIS, ROUTINE W REFLEX MICROSCOPIC
Bilirubin Urine: NEGATIVE
Glucose, UA: NEGATIVE mg/dL
Ketones, ur: NEGATIVE mg/dL
Leukocytes,Ua: NEGATIVE
Nitrite: NEGATIVE
Protein, ur: 30 mg/dL — AB
Specific Gravity, Urine: 1.043 — ABNORMAL HIGH (ref 1.005–1.030)
pH: 6 (ref 5.0–8.0)

## 2019-06-19 LAB — SARS CORONAVIRUS 2 (TAT 6-24 HRS): SARS Coronavirus 2: NEGATIVE

## 2019-06-19 LAB — VITAMIN B12: Vitamin B-12: 434 pg/mL (ref 180–914)

## 2019-06-19 LAB — AMMONIA: Ammonia: 25 umol/L (ref 9–35)

## 2019-06-19 LAB — LIPASE, BLOOD: Lipase: 17 U/L (ref 11–51)

## 2019-06-19 LAB — TSH: TSH: 1.172 u[IU]/mL (ref 0.350–4.500)

## 2019-06-19 MED ORDER — ASPIRIN EC 81 MG PO TBEC
81.0000 mg | DELAYED_RELEASE_TABLET | Freq: Every day | ORAL | Status: DC
  Administered 2019-06-20 – 2019-07-07 (×17): 81 mg via ORAL
  Filled 2019-06-19 (×17): qty 1

## 2019-06-19 MED ORDER — ENOXAPARIN SODIUM 40 MG/0.4ML ~~LOC~~ SOLN
40.0000 mg | SUBCUTANEOUS | Status: DC
  Administered 2019-06-20 – 2019-07-06 (×17): 40 mg via SUBCUTANEOUS
  Filled 2019-06-19 (×18): qty 0.4

## 2019-06-19 MED ORDER — ATORVASTATIN CALCIUM 10 MG PO TABS
20.0000 mg | ORAL_TABLET | Freq: Every day | ORAL | Status: DC
  Administered 2019-06-20 – 2019-07-07 (×17): 20 mg via ORAL
  Filled 2019-06-19 (×17): qty 2

## 2019-06-19 MED ORDER — LEVOTHYROXINE SODIUM 100 MCG PO TABS: 100.0000 ug | ORAL_TABLET | Freq: Every day | ORAL | Status: DC

## 2019-06-19 MED ORDER — SODIUM CHLORIDE 0.9% FLUSH
3.0000 mL | Freq: Two times a day (BID) | INTRAVENOUS | Status: DC
  Administered 2019-06-19 – 2019-07-04 (×23): 3 mL via INTRAVENOUS

## 2019-06-19 MED ORDER — VENLAFAXINE HCL ER 150 MG PO CP24
150.0000 mg | ORAL_CAPSULE | Freq: Every day | ORAL | Status: DC
  Filled 2019-06-19: qty 2
  Filled 2019-06-19: qty 1

## 2019-06-19 MED ORDER — LAMOTRIGINE 100 MG PO TABS
200.0000 mg | ORAL_TABLET | Freq: Every day | ORAL | Status: DC
  Administered 2019-06-20 – 2019-07-07 (×17): 200 mg via ORAL
  Filled 2019-06-19: qty 8
  Filled 2019-06-19 (×3): qty 2
  Filled 2019-06-19 (×2): qty 8
  Filled 2019-06-19: qty 2
  Filled 2019-06-19: qty 8
  Filled 2019-06-19 (×9): qty 2
  Filled 2019-06-19: qty 8
  Filled 2019-06-19 (×2): qty 2
  Filled 2019-06-19 (×2): qty 8
  Filled 2019-06-19 (×2): qty 2

## 2019-06-19 MED ORDER — LORAZEPAM 2 MG/ML IJ SOLN
INTRAMUSCULAR | Status: AC
  Filled 2019-06-19: qty 1

## 2019-06-19 MED ORDER — NICOTINE 21 MG/24HR TD PT24
21.0000 mg | MEDICATED_PATCH | Freq: Every day | TRANSDERMAL | Status: DC
  Administered 2019-06-20 – 2019-07-07 (×18): 21 mg via TRANSDERMAL
  Filled 2019-06-19 (×17): qty 1

## 2019-06-19 MED ORDER — LORAZEPAM 2 MG/ML IJ SOLN
1.0000 mg | INTRAMUSCULAR | Status: AC | PRN
  Administered 2019-06-19 – 2019-06-20 (×2): 1 mg via INTRAVENOUS
  Filled 2019-06-19 (×2): qty 1

## 2019-06-19 MED ORDER — POTASSIUM CHLORIDE CRYS ER 20 MEQ PO TBCR: 30.0000 meq | EXTENDED_RELEASE_TABLET | Freq: Once | ORAL | Status: DC

## 2019-06-19 MED ORDER — PANTOPRAZOLE SODIUM 40 MG PO TBEC
40.0000 mg | DELAYED_RELEASE_TABLET | Freq: Every day | ORAL | Status: DC
  Filled 2019-06-19: qty 1

## 2019-06-19 MED ORDER — SODIUM CHLORIDE 0.9% FLUSH: 3.0000 mL | Freq: Once | INTRAVENOUS | Status: DC

## 2019-06-19 MED ORDER — IOHEXOL 300 MG/ML  SOLN
100.0000 mL | Freq: Once | INTRAMUSCULAR | Status: AC | PRN
  Administered 2019-06-19: 100 mL via INTRAVENOUS

## 2019-06-19 MED ORDER — HALOPERIDOL LACTATE 5 MG/ML IJ SOLN
2.0000 mg | INTRAMUSCULAR | Status: AC | PRN
  Administered 2019-06-19: 2 mg via INTRAMUSCULAR
  Filled 2019-06-19: qty 1

## 2019-06-19 MED ORDER — HYDROCODONE-ACETAMINOPHEN 10-325 MG PO TABS
1.0000 | ORAL_TABLET | Freq: Four times a day (QID) | ORAL | Status: DC | PRN
  Administered 2019-06-20 (×2): 1 via ORAL
  Filled 2019-06-19 (×3): qty 1

## 2019-06-19 NOTE — ED Triage Notes (Signed)
Pt to triage via [facility] for frequent falls.  Last fell at 11pm last night.  Reports generalized weakness.  Per EMS seen at [facility] 2 days ago post fall.  EMS reports pt had L hand, wrist, and rib pain but unsure if x-rays for same done at [facility].  Ace bandage in place on L wrist.  Pt denies any pain.

## 2019-06-19 NOTE — ED Notes (Signed)
Unable to perform neuro checks  To medicated
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
## 2019-06-19 NOTE — ED Notes (Signed)
eeg tech has called she cannot do the study in the hallway   Whenever  The pt has a room  We will call  [phone]

## 2019-06-19 NOTE — Progress Notes (Signed)
EEG complete - results pending 

## 2019-06-19 NOTE — ED Notes (Signed)
Patient transported to CT 

## 2019-06-19 NOTE — ED Notes (Signed)
pts daughters number is [phone]

## 2019-06-19 NOTE — Progress Notes (Signed)
Spoke to RN pt in hallway unable to do exam. RN to call when room is ready.
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
## 2019-06-19 NOTE — ED Notes (Signed)
The pt  Is still medicated too much to do a neuro es\xam  He opened this eyes this time when I called his name  Still has jerking episodes   Like a startled response not answering questions asked  Daughter remains at  The bedside

## 2019-06-19 NOTE — Progress Notes (Signed)
CSW spoke with PA caring for patient, PA states patient is still receiving a medical workup at this time.  CSW to continue following for discharge planning.  Edwin Dada, MSW, LCSW-A Transitions of Care  Clinical Social Worker  [facility] Emergency Departments  Medical ICU [phone]

## 2019-06-19 NOTE — Plan of Care (Signed)
  Problem: Health Behavior/Discharge Planning: Goal: Ability to manage health-related needs will improve Outcome: Progressing   Problem: Clinical Measurements: Goal: Ability to maintain clinical measurements within normal limits will improve Outcome: Progressing Goal: Will remain free from infection Outcome: Progressing Goal: Diagnostic test results will improve Outcome: Progressing Goal: Respiratory complications will improve Outcome: Progressing Goal: Cardiovascular complication will be avoided Outcome: Progressing   Problem: Activity: Goal: Risk for activity intolerance will decrease Outcome: Progressing   Problem: Nutrition: Goal: Adequate nutrition will be maintained Outcome: Progressing   Problem: Pain Managment: Goal: General experience of comfort will improve Outcome: Progressing   Problem: Safety: Goal: Ability to remain free from injury will improve Outcome: Progressing   

## 2019-06-19 NOTE — ED Notes (Signed)
Pt moved to purple rm 56 to get the eeg or attempt it

## 2019-06-19 NOTE — ED Notes (Signed)
IM paged to [phone] Thayer Ohm, RN paged by Marylene Land

## 2019-06-19 NOTE — ED Notes (Addendum)
MRI stated they would not be able to perform MRI without sedation

## 2019-06-19 NOTE — ED Notes (Signed)
Unable to do neuro check  Pt is not awake enough  medicated

## 2019-06-19 NOTE — ED Notes (Signed)
eeg in process 

## 2019-06-19 NOTE — ED Notes (Signed)
Edgard Debord Daughter [phone] notified that she can stay with pt due to dementia and falls.

## 2019-06-19 NOTE — Progress Notes (Signed)
Pt came down for his MRI with his daughter, pt given Haladol and Ativan before arriving to MRI, pt very AMS and unable to follow commands, and had very sudden involuntary whole body jerks and spurts of yelling witnessed by his daughter. Pt did not get his MRI, he would need more sedation in order for to have this exam. Daughter wanted to ask Dr or nurse about how he would get an MRI with sedation or anesthesia

## 2019-06-19 NOTE — ED Provider Notes (Signed)
[facility] EMERGENCY DEPARTMENT Provider Note   CSN: [id number] Arrival date & time: 06/19/19  7494     History Chief Complaint  Patient presents with   Fall   Weakness    Jordan Sawyer is a 74 y.o. male.  74 year old male brought in by EMS from home, daughter at bedside.  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  Daughter states that patient had a stroke at the end of June, was discharged home at the beginning of July, has been cared for by daughter and patient's wife.  Daughter states that they have struggled to take care of the patient ever since he came home from the hospital, care has become progressively more difficult and is at the point where patient has 70 frequent falls and worsening body jerking that daughter does not feel like she can safely get the patient out of bed and care for him.  Daughter reports fall yesterday, patient was taken to [facility] ER, she was not allowed to be with him and does not know what care was provided, has paperwork from [place] with diagnosis of ambulatory dysfunction, multiple rib fractures, multiple contusions.  Patient came home with an Ace wrap to his left arm, and has swelling and bruising to the left hand, diffuse arm with a few skin tears noted to both arms.  Daughter also reports bruising to left side of body.  Daughter states patient has hit his head several times in the falls, unknown if he has had a CT of his head or has any injury to his head.  Most recent fall was at 11 PM last night after he was seen in the hospital yesterday. Patient recently had evaluations for home health and home PT, did miss 1 of these evaluations yesterday since he was in the emergency room however daughter states he is scheduled to have a barium swallow study on Monday with concern for "a floppy epiglottis." Decreased PO intake. Reports no alcohol intake in decades, no drug use.        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 No pertinent surgical history.     Family History  Problem Relation Age of Onset   Hypertension Mother    Hypertension Father     Social History   Tobacco Use   Smoking status: Current Every Day Smoker    Packs/day: 3.00   Smokeless tobacco: Never Used  Substance Use Topics   Alcohol use: Not Currently   Drug use: Not Currently    Home Medications Prior to Admission medications   Medication Sig Start Date End Date Taking? Authorizing Provider  aspirin EC 81 MG tablet Take 1 tablet (81 mg total)  by mouth daily. 01/14/19 01/14/20  Garvin Fila, MD  atorvastatin (LIPITOR) 20 MG tablet Take 1 tablet (20 mg total) by mouth daily. 01/14/19 01/14/20  Garvin Fila, MD  HYDROcodone-acetaminophen [facility]) 10-325 MG tablet  01/13/19   [provider]  lamoTRIgine (LAMICTAL) 200 MG tablet Take 200 mg by mouth daily.    [provider]  levothyroxine (SYNTHROID) 100 MCG tablet  12/31/18   [provider]  lisinopril (ZESTRIL) 20 MG tablet Take 1 tablet (20 mg total) by mouth 2 (two) times a day. 12/18/18   Donzetta Starch, NP  nicotine (NICODERM CQ - DOSED IN MG/24 HOURS) 21 mg/24hr patch Place 1 patch (21  mg total) onto the skin daily. 12/19/18   Donzetta Starch, NP  pantoprazole (PROTONIX) 40 MG tablet Take 40 mg by mouth daily.    [provider]  venlafaxine XR (EFFEXOR-XR) 150 MG 24 hr capsule Take 150 mg by mouth daily with breakfast.    [provider]    Allergies    Patient has no allergy information on record.  Review of Systems   Review of Systems  Unable to perform ROS: Patient nonverbal  Constitutional: Positive for appetite change. Negative for fever.  Musculoskeletal: Positive for arthralgias, gait problem, joint swelling and myalgias.  Skin: Positive for wound.  Allergic/Immunologic: Negative for immunocompromised state.  All other systems reviewed and are negative.   Physical Exam Updated Vital Signs BP 103/77    Pulse 92    Temp 99.3 F (37.4 C) (Rectal)    Resp 18    SpO2 97%   Physical Exam Vitals and nursing note reviewed.  Constitutional:      General: He is not in acute distress.    Appearance: He is well-developed. He is not diaphoretic.  HENT:     Head: Normocephalic and atraumatic.  Eyes:     Pupils: Pupils are equal, round, and reactive to light.  Cardiovascular:     Rate and Rhythm: Normal rate and regular rhythm.     Pulses: Normal pulses.     Heart sounds: Normal heart sounds.  Pulmonary:     Effort: Pulmonary effort is normal.     Breath sounds: Normal breath sounds.  Abdominal:     Palpations: Abdomen is soft.     Tenderness: There is abdominal tenderness.  Musculoskeletal:     Right lower leg: No edema.     Left lower leg: No edema.     Comments: Ecchymosis to left lateral hip, left and right mid axillary ribs.  Ecchymosis and swelling to left upper extremity- TTP.  Old laceration to palm of hand at base of thumb, no active bleeding. Several minor skin tears.   Skin:    General: Skin is warm and dry.     Findings: Bruising present. No erythema.  Neurological:     Mental Status: He is alert. Mental status is at baseline.       Comments: Follows commands, moves extremities, groans. Frequent extremity jerking.     ED Results / Procedures / Treatments   Labs (all labs ordered are listed, but only abnormal results are displayed) Labs Reviewed  BASIC METABOLIC PANEL - Abnormal; Notable for the following components:      Result Value   Potassium 3.4 (*)    Calcium 8.6 (*)    GFR calc non Af Amer 57 (*)    All other components within normal limits  CBC - Abnormal; Notable for the following  components:   RBC 4.17 (*)    Hemoglobin 12.5 (*)    HCT 37.9 (*)    All other components within normal limits  URINALYSIS, ROUTINE W REFLEX MICROSCOPIC - Abnormal; Notable for the following components:   Specific Gravity, Urine 1.043 (*)    Hgb urine dipstick SMALL (*)    Protein, ur 30 (*)    Bacteria, UA RARE (*)    All other components within normal limits  HEPATIC FUNCTION PANEL - Abnormal; Notable for the following components:   Total Protein 6.4 (*)    Albumin 3.2 (*)    Total Bilirubin 1.4 (*)    Bilirubin, Direct 0.3 (*)    Indirect Bilirubin 1.1 (*)    All other components within normal limits  CULTURE, BLOOD (ROUTINE X 2)  CULTURE, BLOOD (ROUTINE X 2)  LIPASE, BLOOD  VITAMIN B1  TSH  AMMONIA  VITAMIN B12    EKG EKG Interpretation  Date/Time:  Saturday June 19 2019 07:21:46 EST Ventricular Rate:  93 PR Interval:  166 QRS Duration: 96 QT Interval:  356 QTC Calculation: 442 R Axis:   -24 Text Interpretation: Normal sinus rhythm Nonspecific ST and T wave abnormality Abnormal ECG Confirmed by Virgina NorfolkAdam, [place] [id number]) on 06/19/2019 9:02:37 AM   Radiology DG Pelvis 1-2 Views  Result Date: 06/19/2019 CLINICAL DATA:  Fall. Generalized weakness. EXAM: PELVIS - 1-2 VIEW COMPARISON:  None. FINDINGS: Both hips appear located. Mild to moderate bilateral hip osteoarthritis. No fractures or dislocation. Degenerative disc disease noted within the lumbar spine. IMPRESSION: 1. No acute findings. 2. Mild to  moderate bilateral hip osteoarthritis. Electronically Signed   By: Signa Kellaylor  Stroud M.D.   On: 06/19/2019 10:00   DG Forearm Left  Result Date: 06/19/2019 CLINICAL DATA:  Weakness.  Status post fall. EXAM: LEFT FOREARM - 2 VIEW COMPARISON:  None. FINDINGS: There is no evidence of fracture or other focal bone lesions. Soft tissues are unremarkable. IMPRESSION: Negative. Electronically Signed   By: Signa Kellaylor  Stroud M.D.   On: 06/19/2019 09:59   CT Head Wo Contrast  Result Date: 06/19/2019 CLINICAL DATA:  Head trauma, minor.  Nonverbal, prior CVA. EXAM: CT HEAD WITHOUT CONTRAST TECHNIQUE: Contiguous axial images were obtained from the base of the skull through the vertex without intravenous contrast. COMPARISON:  Head CT 12/18/2018 FINDINGS: Brain: The examination is moderately motion degraded, limiting evaluation. No evidence of acute intracranial hemorrhage. No demarcated cortical infarction. No evidence of intracranial mass. No midline shift or extra-axial fluid collection. Redemonstrated now chronic infarct within the posterior right basal ganglia and right external capsule. Stable chronic small vessel ischemic disease within the cerebral white matter and pons. Moderate generalized parenchymal atrophy Vascular: No hyperdense vessel.  Atherosclerotic calcifications. Skull: Within the limitations of significant motion degradation, no depressed calvarial fracture is identified. Sinuses/Orbits: Orbits poorly assessed due to the degree of motion degradation. Mild bilateral maxillary sinus mucosal thickening. No significant mastoid effusion. IMPRESSION: Significantly motion degraded, limited examination. No acute intracranial abnormality is identified. Stable generalized parenchymal atrophy and chronic small vessel ischemic disease. Redemonstrated chronic infarct within the posterior right basal ganglia/external capsule. Electronically Signed   By: Jackey LogeKyle  Golden DO   On: 06/19/2019 10:43   CT Chest W  Contrast  Result Date: 06/19/2019 CLINICAL DATA:  Acute generalized abdominal pain after multiple falls. EXAM: CT CHEST, ABDOMEN, AND PELVIS WITH CONTRAST TECHNIQUE: Multidetector CT imaging of the chest, abdomen and pelvis was performed following the standard protocol during bolus administration of intravenous contrast. CONTRAST:  OMNIPAQUE IOHEXOL 300 MG/ML  SOLN COMPARISON:  Oct 31, 2016. FINDINGS: CT CHEST FINDINGS Cardiovascular: Atherosclerosis of thoracic aorta is noted without aneurysm or dissection. Normal cardiac size. No pericardial effusion. Mediastinum/Nodes: No enlarged mediastinal, hilar, or axillary lymph nodes. Thyroid gland, trachea, and esophagus demonstrate no significant findings. Lungs/Pleura: Lungs are clear. No pleural effusion or pneumothorax. Musculoskeletal: Mildly displaced left eleventh rib fracture is noted. CT ABDOMEN PELVIS FINDINGS Hepatobiliary: No focal liver abnormality is seen. No gallstones, gallbladder wall thickening, or biliary dilatation. Pancreas: Unremarkable. No pancreatic ductal dilatation or surrounding inflammatory changes. Spleen: Normal in size without focal abnormality. Adrenals/Urinary Tract: Adrenal glands appear normal. Nonobstructive left renal calculus is noted. Multiple small right renal cysts are noted. No hydronephrosis or renal obstruction is noted. Urinary bladder is unremarkable. Stomach/Bowel: The stomach appears normal. There is no evidence of bowel obstruction or inflammation. The appendix is not visualized. Vascular/Lymphatic: Aortic atherosclerosis. No enlarged abdominal or pelvic lymph nodes. Reproductive: Prostate is unremarkable. Other: No abdominal wall hernia or abnormality. No abdominopelvic ascites. Musculoskeletal: Status post L1 and L3 kyphoplasty. No acute abnormality is noted. IMPRESSION: 1. Mildly displaced left eleventh rib fracture. 2. Aortic atherosclerosis. 3. Nonobstructive left renal calculus. No hydronephrosis or renal  obstruction is noted. 4. No other significant abnormality seen in the chest, abdomen or pelvis. Aortic Atherosclerosis (ICD10-I70.0). Electronically Signed   By: Lupita Raider M.D.   On: 06/19/2019 11:00   CT Abdomen Pelvis W Contrast  Result Date: 06/19/2019 CLINICAL DATA:  Acute generalized abdominal pain after multiple falls. EXAM: CT CHEST, ABDOMEN, AND PELVIS WITH CONTRAST TECHNIQUE: Multidetector CT imaging of the chest, abdomen and pelvis was performed following the standard protocol during bolus administration of intravenous contrast. CONTRAST:  OMNIPAQUE IOHEXOL 300 MG/ML  SOLN COMPARISON:  Oct 31, 2016. FINDINGS: CT CHEST FINDINGS Cardiovascular: Atherosclerosis of thoracic aorta is noted without aneurysm or dissection. Normal cardiac size. No pericardial effusion. Mediastinum/Nodes: No enlarged mediastinal, hilar, or axillary lymph nodes. Thyroid gland, trachea, and esophagus demonstrate no significant findings. Lungs/Pleura: Lungs are clear. No pleural effusion or pneumothorax. Musculoskeletal: Mildly displaced left eleventh rib fracture is noted. CT ABDOMEN PELVIS FINDINGS Hepatobiliary: No focal liver abnormality is seen. No gallstones, gallbladder wall thickening, or biliary dilatation. Pancreas: Unremarkable. No pancreatic ductal dilatation or surrounding inflammatory changes. Spleen: Normal in size without focal abnormality. Adrenals/Urinary Tract: Adrenal glands appear normal. Nonobstructive left renal calculus is noted. Multiple small right renal cysts are noted. No hydronephrosis or renal obstruction is noted. Urinary bladder is unremarkable. Stomach/Bowel: The stomach appears normal. There is no evidence of bowel obstruction or inflammation. The appendix is not visualized. Vascular/Lymphatic: Aortic atherosclerosis. No enlarged abdominal or pelvic lymph nodes. Reproductive: Prostate is unremarkable. Other: No abdominal wall hernia or abnormality. No abdominopelvic ascites.  Musculoskeletal: Status post L1 and L3 kyphoplasty. No acute abnormality is noted. IMPRESSION: 1. Mildly displaced left eleventh rib fracture. 2. Aortic atherosclerosis. 3. Nonobstructive left renal calculus. No hydronephrosis or renal obstruction is noted. 4. No other significant abnormality seen in the chest, abdomen or pelvis. Aortic Atherosclerosis (ICD10-I70.0). Electronically Signed   By: Lupita Raider M.D.   On: 06/19/2019 11:00   DG Humerus Left  Result Date: 06/19/2019 CLINICAL DATA:  No weakness.  Fall last night. EXAM: LEFT HUMERUS - 2+ VIEW COMPARISON:  None. FINDINGS: There is no evidence of fracture or other focal bone lesions. Soft tissues are unremarkable. IMPRESSION: Negative. Electronically Signed   By: Signa Kell M.D.   On: 06/19/2019 09:58  DG Hand Complete Left  Result Date: 06/19/2019 CLINICAL DATA:  Left hand pain after fall last night. EXAM: LEFT HAND - COMPLETE 3+ VIEW COMPARISON:  None. FINDINGS: There is no evidence of fracture or dislocation. Severe degenerative changes seen involving the first carpometacarpal joint. Soft tissues are unremarkable. 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 No fracture or dislocation is noted. Electronically Signed   By: Lupita Raider M.D.   On: 06/19/2019 10:01    Procedures Procedures (including critical care time)  Medications Ordered in ED Medications  sodium chloride flush (NS) 0.9 % injection 3 mL (3 mLs Intravenous Not Given 06/19/19 0700)  iohexol (OMNIPAQUE) 300 MG/ML solution 100 mL (100 mLs Intravenous Contrast Given 06/19/19 1033)    ED Course  I have reviewed the triage vital signs and the nursing notes.  Pertinent labs & imaging results that were available during my care of the patient were reviewed by me and considered in my medical decision making (see chart for details).  Clinical Course as of Jun 18 1206  Sat Jun 19, 2019  1205 73yo male brought in by EMS from home for  multiple falls and abnormal body movement/jerking. CT head, chest/abd/pelvis, left upper extremity XR, finding of isolated left 11th rib fracture.  Labs without significant findings.  Patient was seen by Dr. Lockie Mola, ER attending who has discussed case with neurology.  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  Case discussed with hospitalist (PCP unassigned) who will consult for admission.   [LM]    Clinical Course User Index [LM] Alden Hipp   MDM Rules/Calculators/A&P                     Final Clinical Impression(s) / ED Diagnoses Final diagnoses:  Weakness  Myoclonic jerking    Rx / DC Orders ED Discharge Orders    None       Jeannie Fend, PA-C 06/19/19 1208    Virgina Norfolk, DO 06/19/19 1341

## 2019-06-19 NOTE — ED Notes (Signed)
eeg contacted they are on their way here

## 2019-06-19 NOTE — ED Notes (Signed)
Patient transported to X-ray 

## 2019-06-19 NOTE — Consult Note (Signed)
NEURO HOSPITALIST CONSULT NOTE   Requesting physician: Dr. Celso Sickleuralto   Reason for Consult:fall/weakness/ jerking    History obtained from:  Patient/ daughter    HPI:                                                                                                                                          Jordan Sawyer is an 74 y.o. male CVA (12/15/18), HTN, COPD, hypothyroidism who presented to [facility] ED s/p fall weakness.   History is taken from daughter: per daughter patient has been declining since his stroke in June 2020, but with the most drastic change being over the past week. In the past week he has had increased falling, increased jerking, and has not been able to help assist with ambulation. Patient fell 06/18/19 and was taken to  hospital where he as dx with  Ambulatory dysfunction multiple contusions, multiple rib fractures. Since the stroke in June patient has been cared for by daughter and his wife, but his  Health has been declining and she does not feel safe taking care of patient at this point. Patient is incontinent now. Patient was scheduled for a barium swallow study d/t  " floppy epiglottis" and he coughs and coughs when eating. Patient over the past two day has also had a decline in eating. Yesterday he only drank 1 can of ensure. Was recently on antibiotics ( last week) for cellulitis.daughter does not know name of antibiotic. Daughter denies any medications changes, fevers, sick contacts.   CTH: no  Hemorrhage UA: - UTI  Past Medical History:  Diagnosis Date  . Anxiety   . COPD (chronic obstructive pulmonary disease) (HCC)   . Dementia (HCC)   . Hypertension   . Hypothyroidism   . Kidney stones   . Migraine   . Stroke (HCC)   . Tobacco abuse     History reviewed. No pertinent surgical history.  Family History  Problem Relation Age of Onset  . Hypertension Mother   . Hypertension Father       Social History:  reports that he has  been smoking. He has been smoking about 3.00 packs per day. He has never used smokeless tobacco. He reports previous alcohol use. He reports previous drug use.  Not on File  MEDICATIONS:  Current Facility-Administered Medications  Medication Dose Route Frequency Provider Last Rate Last Admin  . sodium chloride flush (NS) 0.9 % injection 3 mL  3 mL Intravenous Once Lennice Sites, DO       Current Outpatient Medications  Medication Sig Dispense Refill  . aspirin EC 81 MG tablet Take 1 tablet (81 mg total) by mouth daily. 150 tablet 2  . atorvastatin (LIPITOR) 20 MG tablet Take 1 tablet (20 mg total) by mouth daily. 30 tablet 11  . HYDROcodone-acetaminophen (NORCO) 10-325 MG tablet     . lamoTRIgine (LAMICTAL) 200 MG tablet Take 200 mg by mouth daily.    Marland Kitchen levothyroxine (SYNTHROID) 100 MCG tablet     . lisinopril (ZESTRIL) 20 MG tablet Take 1 tablet (20 mg total) by mouth 2 (two) times a day. 30 tablet 2  . nicotine (NICODERM CQ - DOSED IN MG/24 HOURS) 21 mg/24hr patch Place 1 patch (21 mg total) onto the skin daily. 28 patch 0  . pantoprazole (PROTONIX) 40 MG tablet Take 40 mg by mouth daily.    Marland Kitchen venlafaxine XR (EFFEXOR-XR) 150 MG 24 hr capsule Take 150 mg by mouth daily with breakfast.        ROS:                                                                                                                                        unobtainable from patient due to mental status     Blood pressure 103/77, pulse 92, temperature 99.3 F (37.4 C), temperature source Rectal, resp. rate 18, SpO2 97 %.   General Examination:                                                                                                       Physical Exam  Constitutional: Appears well-developed and well-nourished.  Psych: Affect appropriate to situation Eyes: Normal external eye and  conjunctiva. HENT: Normocephalic, no lesions, without obvious abnormality.  Oral mucosa  dry. Musculoskeletal- left hand and forearm with edema and multiple contusions, skin tears Cardiovascular: Normal rate and regular rhythm.  Respiratory: Effort normal, non-labored breathing saturations WNL GI: Soft.  No distension. There is no tenderness.  Skin: WDI  Neurological Examination Mental Status: drowsy, oriented to name and age, thought content appropriate.  dysarthraa present  Able to follow commands without difficulty. myoclonic jerking present Cranial Nerves: II: Visual fields grossly normal,  III,IV, VI: ptosis not present, extra-ocular motions intact bilaterally pupils equal, round, reactive to  light and accommodation V,VII: smile symmetric, facial light touch sensation normal bilaterally VIII: hearing normal bilaterally IX,X: uvula rises symmetrically XI: bilateral shoulder shrug XII: midline tongue extension Motor: Able to move all 4 extremities anti gravity to command Myoclonic jerking present Tone and bulk:normal tone throughout; no atrophy noted Sensory: light touch intact throughout, bilaterally Deep Tendon Reflexes: 2+ and symmetric biceps, BLE hyperreflexic Cerebellar: No ataxia Gait:deferred   Lab Results: Basic Metabolic Panel: Recent Labs  Lab 06/19/19 0729  NA 135  K 3.4*  CL 101  CO2 23  GLUCOSE 99  BUN 15  CREATININE 1.23  CALCIUM 8.6*    CBC: Recent Labs  Lab 06/19/19 0729  WBC 8.6  HGB 12.5*  HCT 37.9*  MCV 90.9  PLT 285    Imaging: DG Pelvis 1-2 Views  Result Date: 06/19/2019 CLINICAL DATA:  Fall. Generalized weakness. EXAM: PELVIS - 1-2 VIEW COMPARISON:  None. FINDINGS: Both hips appear located. Mild to moderate bilateral hip osteoarthritis. No fractures or dislocation. Degenerative disc disease noted within the lumbar spine. IMPRESSION: 1. No acute findings. 2. Mild to moderate bilateral hip osteoarthritis. Electronically Signed   By:  Signa Kell M.D.   On: 06/19/2019 10:00   DG Forearm Left  Result Date: 06/19/2019 CLINICAL DATA:  Weakness.  Status post fall. EXAM: LEFT FOREARM - 2 VIEW COMPARISON:  None. FINDINGS: There is no evidence of fracture or other focal bone lesions. Soft tissues are unremarkable. IMPRESSION: Negative. Electronically Signed   By: Signa Kell M.D.   On: 06/19/2019 09:59   CT Head Wo Contrast  Result Date: 06/19/2019 CLINICAL DATA:  Head trauma, minor.  Nonverbal, prior CVA. EXAM: CT HEAD WITHOUT CONTRAST TECHNIQUE: Contiguous axial images were obtained from the base of the skull through the vertex without intravenous contrast. COMPARISON:  Head CT 12/18/2018 FINDINGS: Brain: The examination is moderately motion degraded, limiting evaluation. No evidence of acute intracranial hemorrhage. No demarcated cortical infarction. No evidence of intracranial mass. No midline shift or extra-axial fluid collection. Redemonstrated now chronic infarct within the posterior right basal ganglia and right external capsule. Stable chronic small vessel ischemic disease within the cerebral white matter and pons. Moderate generalized parenchymal atrophy Vascular: No hyperdense vessel.  Atherosclerotic calcifications. Skull: Within the limitations of significant motion degradation, no depressed calvarial fracture is identified. Sinuses/Orbits: Orbits poorly assessed due to the degree of motion degradation. Mild bilateral maxillary sinus mucosal thickening. No significant mastoid effusion. IMPRESSION: Significantly motion degraded, limited examination. No acute intracranial abnormality is identified. Stable generalized parenchymal atrophy and chronic small vessel ischemic disease. Redemonstrated chronic infarct within the posterior right basal ganglia/external capsule. Electronically Signed   By: Jackey Loge DO   On: 06/19/2019 10:43   CT Chest W Contrast  Result Date: 06/19/2019 CLINICAL DATA:  Acute generalized abdominal pain  after multiple falls. EXAM: CT CHEST, ABDOMEN, AND PELVIS WITH CONTRAST TECHNIQUE: Multidetector CT imaging of the chest, abdomen and pelvis was performed following the standard protocol during bolus administration of intravenous contrast. CONTRAST:  OMNIPAQUE IOHEXOL 300 MG/ML  SOLN COMPARISON:  Oct 31, 2016. FINDINGS: CT CHEST FINDINGS Cardiovascular: Atherosclerosis of thoracic aorta is noted without aneurysm or dissection. Normal cardiac size. No pericardial effusion. Mediastinum/Nodes: No enlarged mediastinal, hilar, or axillary lymph nodes. Thyroid gland, trachea, and esophagus demonstrate no significant findings. Lungs/Pleura: Lungs are clear. No pleural effusion or pneumothorax. Musculoskeletal: Mildly displaced left eleventh rib fracture is noted. CT ABDOMEN PELVIS FINDINGS Hepatobiliary: No focal liver abnormality is seen. No gallstones, gallbladder  wall thickening, or biliary dilatation. Pancreas: Unremarkable. No pancreatic ductal dilatation or surrounding inflammatory changes. Spleen: Normal in size without focal abnormality. Adrenals/Urinary Tract: Adrenal glands appear normal. Nonobstructive left renal calculus is noted. Multiple small right renal cysts are noted. No hydronephrosis or renal obstruction is noted. Urinary bladder is unremarkable. Stomach/Bowel: The stomach appears normal. There is no evidence of bowel obstruction or inflammation. The appendix is not visualized. Vascular/Lymphatic: Aortic atherosclerosis. No enlarged abdominal or pelvic lymph nodes. Reproductive: Prostate is unremarkable. Other: No abdominal wall hernia or abnormality. No abdominopelvic ascites. Musculoskeletal: Status post L1 and L3 kyphoplasty. No acute abnormality is noted. IMPRESSION: 1. Mildly displaced left eleventh rib fracture. 2. Aortic atherosclerosis. 3. Nonobstructive left renal calculus. No hydronephrosis or renal obstruction is noted. 4. No other significant abnormality seen in the chest, abdomen or  pelvis. Aortic Atherosclerosis (ICD10-I70.0). Electronically Signed   By: Lupita Raider M.D.   On: 06/19/2019 11:00   CT Abdomen Pelvis W Contrast  Result Date: 06/19/2019 CLINICAL DATA:  Acute generalized abdominal pain after multiple falls. EXAM: CT CHEST, ABDOMEN, AND PELVIS WITH CONTRAST TECHNIQUE: Multidetector CT imaging of the chest, abdomen and pelvis was performed following the standard protocol during bolus administration of intravenous contrast. CONTRAST:  OMNIPAQUE IOHEXOL 300 MG/ML  SOLN COMPARISON:  Oct 31, 2016. FINDINGS: CT CHEST FINDINGS Cardiovascular: Atherosclerosis of thoracic aorta is noted without aneurysm or dissection. Normal cardiac size. No pericardial effusion. Mediastinum/Nodes: No enlarged mediastinal, hilar, or axillary lymph nodes. Thyroid gland, trachea, and esophagus demonstrate no significant findings. Lungs/Pleura: Lungs are clear. No pleural effusion or pneumothorax. Musculoskeletal: Mildly displaced left eleventh rib fracture is noted. CT ABDOMEN PELVIS FINDINGS Hepatobiliary: No focal liver abnormality is seen. No gallstones, gallbladder wall thickening, or biliary dilatation. Pancreas: Unremarkable. No pancreatic ductal dilatation or surrounding inflammatory changes. Spleen: Normal in size without focal abnormality. Adrenals/Urinary Tract: Adrenal glands appear normal. Nonobstructive left renal calculus is noted. Multiple small right renal cysts are noted. No hydronephrosis or renal obstruction is noted. Urinary bladder is unremarkable. Stomach/Bowel: The stomach appears normal. There is no evidence of bowel obstruction or inflammation. The appendix is not visualized. Vascular/Lymphatic: Aortic atherosclerosis. No enlarged abdominal or pelvic lymph nodes. Reproductive: Prostate is unremarkable. Other: No abdominal wall hernia or abnormality. No abdominopelvic ascites. Musculoskeletal: Status post L1 and L3 kyphoplasty. No acute abnormality is noted. IMPRESSION: 1.  Mildly displaced left eleventh rib fracture. 2. Aortic atherosclerosis. 3. Nonobstructive left renal calculus. No hydronephrosis or renal obstruction is noted. 4. No other significant abnormality seen in the chest, abdomen or pelvis. Aortic Atherosclerosis (ICD10-I70.0). Electronically Signed   By: Lupita Raider M.D.   On: 06/19/2019 11:00   DG Humerus Left  Result Date: 06/19/2019 CLINICAL DATA:  No weakness.  Fall last night. EXAM: LEFT HUMERUS - 2+ VIEW COMPARISON:  None. FINDINGS: There is no evidence of fracture or other focal bone lesions. Soft tissues are unremarkable. IMPRESSION: Negative. Electronically Signed   By: Signa Kell M.D.   On: 06/19/2019 09:58   DG Hand Complete Left  Result Date: 06/19/2019 CLINICAL DATA:  Left hand pain after fall last night. EXAM: LEFT HAND - COMPLETE 3+ VIEW COMPARISON:  None. FINDINGS: There is no evidence of fracture or dislocation. Severe degenerative changes seen involving the first carpometacarpal joint. Soft tissues are unremarkable. 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 No fracture or dislocation is noted. Electronically Signed   By: Lupita Raider M.D.   On: 06/19/2019 10:01  Assessment:  Jordan Sawyer is an 74 y.o. male CVA (12/15/18), HTN, COPD, hypothyroidism who presented to [facility] ED s/p fall weakness. increasing myoclonic jerks over the past week. Will obtain EEG to r/o seizure. MRI brain to assess brain for any new strokes or abnormalities. Thiamine, B12, ammonia, TSH and blood cultures to assess for infection or metabolic abnormalities causing these myocolonic jerks.     Recommendations: blood culture, thiamine level, B12, ammonia, TSH chest x-ray MRI brain EEG Pt/ OT/ Speech  Case management for potential home care options  Valentina Lucks, MSN, NP-C [facility] [phone]  Attending neurologist's note to follow   06/19/2019, 11:40 AM

## 2019-06-19 NOTE — ED Notes (Signed)
Report called to rn on 2w  The daughter at bedside is not allowed to stay with the pt   The daughter lives in [place]  The daughter became very upset whne she was told that the floor would not let het stay  The pts brother died 4 years ago today   aslo

## 2019-06-19 NOTE — ED Notes (Signed)
Admitting at bedside. Pt very agitated and trying to get out of bed. MD requesting ativan and haldol.

## 2019-06-19 NOTE — H&P (Addendum)
Date: 06/19/2019               Patient Name:  Jordan Sawyer MRN: [id number]  DOB: 23-Aug-1945 Age / Sex: 74 y.o., male   PCP: Zoila Shutter, NP         Medical Service: Internal Medicine Teaching Service         Attending Physician: Dr. Lucious Groves, DO    First Contact: Dr. Sheppard Coil Pager: [phone]  Second Contact: Dr. Eileen Stanford Pager: [phone]       After Hours (After 5p/  First Contact Pager: [phone]  weekends / holidays): Second Contact Pager: [phone]   Chief Complaint: Recurrent falls  History of Present Illness:   *Collateral information obtained from ED provider, chart review and patient's daughter*  Mr. Hochstatter is a 74 year old male with past medical history significant for dementia, CVA in June 2020, COPD, HTN, GERD, hypothyroidism, tobacco use disorder presents today after sustaining multiple falls and jerking.   Per chart review he was admitted to the hospital from December 15, 2018 to January 14, 2019 for an acute lacunar stroke involving the right brain.  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  The initial plan was for him to be discharged to inpatient rehab however, given the improved progression of his symptoms, he was discharged home with home health.  Since that time, the the patient's had functional decline, most drastically within the past week.  He has had increased number of falls, involuntary jerking, and not able to ambulate without assistance.  On 06/18/2019, patient was taken to [facility] where he was diagnosed with ambulatory dysfunction, multiple contusions and rib fractures.  The patient has also been choking, and coughing when he eats over the past 2 days.  Yesterday, he only drink 1 can of Ensure.  At this point, the patient's caretakers, his daughter and wife, are overwhelmed and do not  feel they can care for him in his current condition.  In the ED, a CBC, CMP, lipase, and UA were ordered.  The patient had mild hypokalemia to 3.4, but otherwise had normal lab values. CT of the head, chest, abdomen and pelvis were obtained.  CT of the head showed no acute intracranial abnormality. CT of the chest demonstrated a mildly displaced left 11th rib fracture.  CT of the abdomen and pelvis showed no significant findings. Neurology has also seen the pt in the ED.  Meds:  No outpatient medications have been marked as taking for the 06/19/19 encounter [facility] Encounter).   Allergies: Allergies as of 06/19/2019   (Not on File)   Past Medical History:  Diagnosis Date   Anxiety    COPD (chronic obstructive pulmonary disease) ([place])    Dementia (HCC)    Hypertension    Hypothyroidism    Kidney stones    Migraine    Stroke ([place])    Tobacco abuse    Family History:  Family History  Problem Relation Age of Onset   Hypertension Mother    Hypertension Father    Social History:  -Lives with wife and grandson -Gets breat care -Gets around with a wheel chair and walker  -Smokes cigarettes 3 PPD  Review of Systems: Unable to perform ROS due to patient agitation.   Physical Exam: Blood pressure 103/77, pulse 92, temperature 99.3 F (37.4 C), temperature  source Rectal, resp. rate 18, SpO2 97 %.  Physical Exam Vitals reviewed.  Constitutional:      General: He is not in acute distress.    Appearance: He is normal weight. He is not ill-appearing or toxic-appearing.     Comments: Agitated   HENT:     Head: Normocephalic.     Mouth/Throat:     Mouth: Mucous membranes are dry.     Comments: Brown stained tongue  Eyes:     General: No scleral icterus.       Right eye: No discharge.        Left eye: No discharge.     Extraocular Movements: Extraocular movements intact.     Pupils: Pupils are equal, round, and reactive to light.  Cardiovascular:     Rate and  Rhythm: Normal rate and regular rhythm.     Pulses: Normal pulses.     Heart sounds: Normal heart sounds. No murmur. No friction rub. No gallop.   Pulmonary:     Effort: Pulmonary effort is normal. No respiratory distress.     Breath sounds: Wheezing (throughout all lung fields) present.     Comments: Barrel chest Abdominal:     General: Abdomen is flat. Bowel sounds are normal. There is no distension.     Palpations: Abdomen is soft.     Tenderness: There is no abdominal tenderness. There is no guarding.  Musculoskeletal:     Comments: Tenderness to the L 11th rib at the mid axillary line. Grimaces with movement.  Skin:    General: Skin is warm.     Findings: Bruising (Diffuse bruising present on L upper arm) present.  Neurological:     Mental Status: He is alert. He is disoriented.     Cranial Nerves: No cranial nerve deficit.     Comments: Alert and oriented to person, place, year and birthday. However, pt doesn't know he is the hospital, but knows he is at [facility]. He also doesn't remember how he got here.  Unable to assess strength due to agitation, however pt moves all extremities spontaneously.   Appears to have myoclonic jerks of the upper extremities at rest  Psychiatric:     Comments: Agitated, combative. Threatening to hit the providers near him    CT Head: IMPRESSION: Significantly motion degraded, limited examination.  No acute intracranial abnormality is identified.  Stable generalized parenchymal atrophy and chronic small vessel ischemic disease. Redemonstrated chronic infarct within the posterior right basal ganglia/external capsule.  CT Chest/Abdomen/Pelvis: IMPRESSION: 1. Mildly displaced left eleventh rib fracture. 2. Aortic atherosclerosis. 3. Nonobstructive left renal calculus. No hydronephrosis or renal obstruction is noted. 4. No other significant abnormality seen in the chest, abdomen or pelvis.  EKG: Normal sinus rhythm Nonspecific ST and  T wave abnormality Abnormal ECG  Xray Pelvis: IMPRESSION: 1. No acute findings. 2. Mild to moderate bilateral hip osteoarthritis.  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 No fracture or dislocation is noted.  Assessment & Plan by Problem: Active Problems:   Frequent falls  In summary, Mr. Lienhard is a 74 year old male with past medical history significant for dementia, CVA in June 2020, COPD, HTN, GERD, hypothyroidism, tobacco use disorder presents today after sustaining multiple falls over the last several months, more frequently this past week, upper extremity myoclonic jerking. The patient is currently agitated and refusing to cooperate with medical staff,threatening to  hit personnel, demanding to leave.  The patient is not able to make his own decisions at this point time.  We called the patient's wife at the bedside and put her on speaker phone, and states that he is not able to take care of him and he needs to go to the hospital at this time.  #Hx CVA #Multiple Falls #Myoclonic jerks: Neurology saw the patient in the ED. CT of the the head did not show an acute process.Will appreciate recommendations.  At this time, it is unclear what is causing the patient to have increased falls and myoclonic jerks.  He will be admitted for further work-up. - Appreciate Neurology recommendations  -Blood cultures, thiamine level, B12, ammonia, TSH  -MRI Brain  -EEG  -PT/OT/SLP -Aspirin 81 mg daily  -Orthostatic vitals ordered  #Dementia #Aggitation: Pt is yelling at the staff and demanding to go home. We called the wife and had her on speaker phone to try to calm the patient down, but he continued to threaten staff and demand to go home. He also was attempting to get up from the hospital bed. We attempted to call one of his daughters, but we did not get an  answer. Security was called and the pt received Ativan 1mg  as well as Haldol 2 mg in the ED.  -Ativan 1mg   PRN -Haldol 2mg  PRN  -Continue home venlafaxine   #L 11th rib fx: Likely caused by fall.  Confirmed by CT of the chest -Norco 10-325 mg every 6 hours as needed  #COPD #Tobacco abuse -Nicotine patch 21 mg  #HTN: Patient takes lisinopril milligrams daily at home.  Patient's BP soft at 103/77 currently. -Home BP meds in the setting of falls  #Hypothyroidism -Synthroid 100 mcg daily  #GERD -Protonix 40 mg daily  #FEN/GI -Diet: Low sodium -Fluids: none  #DVT prophylaxis -SCDs  #CODE STATUS: FULL  #Dispo: Admit patient to Inpatient with expected length of stay greater than 2 midnights.  Signed: , MD Internal Medicine, PGY1 Pager: [phone]  06/19/2019,1:36 PM

## 2019-06-19 NOTE — Procedures (Signed)
ELECTROENCEPHALOGRAM REPORT   Patient: Jordan Sawyer       Room #: W893W EEG No. ID: [id number] Age: 74 y.o.        Sex: male Referring Physician: Mikey Bussing Report Date:  06/19/2019        Interpreting Physician: Thana Farr  History: [name] is an 74 y.o. male with jerking episodes  Medications:  ASA, Lipitor, Lamictal, Synthroid, Effexor  Conditions of Recording:  This is a 21 channel routine scalp EEG performed with bipolar and monopolar montages arranged in accordance to the international 10/20 system of electrode placement. One channel was dedicated to EKG recording.  The patient is in the drowsy and asleep states.  Description:  The patient is drowsy and asleep for this recording.  During drowse the background is slow and irregular consisting of low voltage theta and delta activity with occasional superimposed beta.   The patient goes in to a light sleep with symmetrical sleep spindles, vertex central sharp transients and irregular slow activity.   There are multiple episodes of jerking captured during the recording.  Although muscle artifact is seen no distinct epileptiform activity is noted.   Hyperventilation and intermittent photic stimulation were not performed.   IMPRESSION: This electroencephalogram is characterized by normal drowse and sleep.  Multiple episodes of jerking are noted without associated epileptiform activity.     Thana Farr, MD Neurology [phone] 06/19/2019, 6:30 PM

## 2019-06-20 ENCOUNTER — Inpatient Hospital Stay (HOSPITAL_COMMUNITY): Payer: Medicare PPO

## 2019-06-20 DIAGNOSIS — S2232XD Fracture of one rib, left side, subsequent encounter for fracture with routine healing: Secondary | ICD-10-CM

## 2019-06-20 DIAGNOSIS — F0391 Unspecified dementia with behavioral disturbance: Secondary | ICD-10-CM

## 2019-06-20 DIAGNOSIS — K219 Gastro-esophageal reflux disease without esophagitis: Secondary | ICD-10-CM

## 2019-06-20 DIAGNOSIS — E039 Hypothyroidism, unspecified: Secondary | ICD-10-CM

## 2019-06-20 DIAGNOSIS — Z720 Tobacco use: Secondary | ICD-10-CM

## 2019-06-20 DIAGNOSIS — I10 Essential (primary) hypertension: Secondary | ICD-10-CM

## 2019-06-20 DIAGNOSIS — Z79890 Hormone replacement therapy (postmenopausal): Secondary | ICD-10-CM

## 2019-06-20 DIAGNOSIS — Z79899 Other long term (current) drug therapy: Secondary | ICD-10-CM

## 2019-06-20 DIAGNOSIS — J449 Chronic obstructive pulmonary disease, unspecified: Secondary | ICD-10-CM

## 2019-06-20 DIAGNOSIS — W19XXXD Unspecified fall, subsequent encounter: Secondary | ICD-10-CM

## 2019-06-20 DIAGNOSIS — G253 Myoclonus: Secondary | ICD-10-CM

## 2019-06-20 DIAGNOSIS — Z8673 Personal history of transient ischemic attack (TIA), and cerebral infarction without residual deficits: Secondary | ICD-10-CM

## 2019-06-20 DIAGNOSIS — R296 Repeated falls: Secondary | ICD-10-CM

## 2019-06-20 LAB — BASIC METABOLIC PANEL
Anion gap: 11 (ref 5–15)
BUN: 17 mg/dL (ref 8–23)
CO2: 22 mmol/L (ref 22–32)
Calcium: 8.7 mg/dL — ABNORMAL LOW (ref 8.9–10.3)
Chloride: 105 mmol/L (ref 98–111)
Creatinine, Ser: 1.08 mg/dL (ref 0.61–1.24)
GFR calc Af Amer: >60 mL/min (ref >60)
GFR calc non Af Amer: >60 mL/min (ref >60)
Glucose, Bld: 80 mg/dL (ref 70–99)
Potassium: 3.4 mmol/L — ABNORMAL LOW (ref 3.5–5.1)
Sodium: 138 mmol/L (ref 135–145)

## 2019-06-20 LAB — CBC
HCT: 37.1 % — ABNORMAL LOW (ref 39.0–52.0)
Hemoglobin: 12.4 g/dL — ABNORMAL LOW (ref 13.0–17.0)
MCH: 29.8 pg (ref 26.0–34.0)
MCHC: 33.4 g/dL (ref 30.0–36.0)
MCV: 89.2 fL (ref 80.0–100.0)
Platelets: 265 10*3/uL (ref 150–400)
RBC: 4.16 MIL/uL — ABNORMAL LOW (ref 4.22–5.81)
RDW: 13.1 % (ref 11.5–15.5)
WBC: 7.7 10*3/uL (ref 4.0–10.5)
nRBC: 0 % (ref 0.0–0.2)

## 2019-06-20 MED ORDER — ORAL CARE MOUTH RINSE
15.0000 mL | Freq: Two times a day (BID) | OROMUCOSAL | Status: DC
  Administered 2019-06-20 – 2019-07-07 (×31): 15 mL via OROMUCOSAL

## 2019-06-20 MED ORDER — POTASSIUM CHLORIDE CRYS ER 20 MEQ PO TBCR: 40.0000 meq | EXTENDED_RELEASE_TABLET | Freq: Once | ORAL | Status: DC

## 2019-06-20 MED ORDER — HALOPERIDOL LACTATE 5 MG/ML IJ SOLN
2.0000 mg | Freq: Once | INTRAMUSCULAR | Status: AC
  Administered 2019-06-20: 2 mg via INTRAMUSCULAR
  Filled 2019-06-20: qty 1

## 2019-06-20 MED ORDER — POTASSIUM CHLORIDE 10 MEQ/100ML IV SOLN
10.0000 meq | INTRAVENOUS | Status: AC
  Administered 2019-06-20 (×2): 10 meq via INTRAVENOUS
  Filled 2019-06-20 (×2): qty 100

## 2019-06-20 MED ORDER — LEVOTHYROXINE SODIUM 100 MCG/5ML IV SOLN
50.0000 ug | Freq: Every day | INTRAVENOUS | Status: DC
  Administered 2019-06-20 – 2019-06-22 (×3): 50 ug via INTRAVENOUS
  Filled 2019-06-20 (×3): qty 5

## 2019-06-20 MED ORDER — PANTOPRAZOLE SODIUM 40 MG IV SOLR
40.0000 mg | INTRAVENOUS | Status: DC
  Administered 2019-06-20 – 2019-06-22 (×3): 40 mg via INTRAVENOUS
  Filled 2019-06-20 (×3): qty 40

## 2019-06-20 MED ORDER — LACTATED RINGERS IV SOLN: INTRAVENOUS | Status: DC

## 2019-06-20 MED ORDER — POTASSIUM CHLORIDE 10 MEQ/100ML IV SOLN
10.0000 meq | INTRAVENOUS | Status: DC
  Administered 2019-06-20 (×3): 10 meq via INTRAVENOUS
  Filled 2019-06-20 (×3): qty 100

## 2019-06-20 MED ORDER — CLONAZEPAM 0.25 MG PO TBDP
0.2500 mg | ORAL_TABLET | Freq: Two times a day (BID) | ORAL | Status: DC
  Administered 2019-06-20 – 2019-06-22 (×5): 0.25 mg via ORAL
  Filled 2019-06-20 (×6): qty 1

## 2019-06-20 MED ORDER — HYDROMORPHONE HCL 1 MG/ML IJ SOLN
0.5000 mg | INTRAMUSCULAR | Status: DC | PRN
  Administered 2019-06-20 – 2019-06-22 (×2): 0.5 mg via INTRAVENOUS
  Filled 2019-06-20 (×3): qty 1

## 2019-06-20 NOTE — Evaluation (Signed)
Clinical/Bedside Swallow Evaluation Patient Details  Name: Jordan Sawyer MRN: [id number] Date of Birth: March 16, 1946  Today's Date: 06/20/2019 Time: SLP Start Time (ACUTE ONLY): 0803 SLP Stop Time (ACUTE ONLY): 0830 SLP Time Calculation (min) (ACUTE ONLY): 27 min  Past Medical History:  Past Medical History:  Diagnosis Date  . Anxiety   . COPD (chronic obstructive pulmonary disease) (HCC)   . Dementia (HCC)   . Hypertension   . Hypothyroidism   . Kidney stones   . Migraine   . Stroke (HCC) 12/15/2018  . Tobacco abuse    Past Surgical History: History reviewed. No pertinent surgical history. HPI:  Jordan Sawyer is a 74 year old male with past medical history significant for dementia, CVA in June 2020, COPD, HTN, GERD, hypothyroidism, tobacco use disorder presents today after sustaining multiple falls and jerking. Per chart review he was admitted to the hospital from December 15, 2018 to January 14, 2019 for an acute lacunar stroke involving the right brain.  He received TPA and unfortunately had a small right 67 mm thalamic post TPA hemorrhagic conversion without cytotoxic edema or intraventricular extension.  Pt completed a MBS on 12/17/18 which reported functional oropharyngeal dysphagia with suspected primary esophageal dysphagia.     Assessment / Plan / Recommendation Clinical Impression  Pt presents with oral dysphagia and suspected pharyngeal and/or esophageal dysphagia.  He was encountered awake/alert with bilateral mitts in place and he was observed to have whole body jerking movements upon SLP arrival and throughout this evaluation.  Pt with known hx of esophageal dysphagia; however, he was unable to elaborate further secondary to current mentation.  Oral mech exam was remarkable for generalized oral weakness, reduced labial ROM on the L side, edentulism, and a significantly dry oral cavity with dried secretions on his palate and lingual surface.  Oral care was completed prior to PO trials.   Pt was observed with trials of ice chips, thin liquid, and puree.  He required min-mod verbal and tactile cues for bolus acceptance with the spoon and straw.  Pt initially used the straw to blow bubbles into the water cup; however, he was eventually able to draw liquid from the straw given cues.  Pt exhibited a immediate cough with thin liquid via serial straw sip x1; otherwise no clinical s/sx of aspiration were observed with liquid trials.  Pt had decreased awareness of ice chip and puree trials with reduced lingual manipulation and prolonged AP transport.  No swallow initiation was attempted with puree trials despite cues and they were subsequently cleared from his oral cavity secondary to aspiration concerns.  Pt declined additional PO trials.  Suspect that PO tolerance will improved if/when mentation improves.  Of note, pt was observed to have neologisms and language of confusion throughout this evaluation.  RN was made aware.  Pt is not appropriate for a full PO diet at this time; however, he may have small ice chips and small spoon sips of water throughout the day following thorough oral care to mobilize swallow musculature and to help keep oral mucosa moistened.  Relayed all recommendations to the RN.  SLP will f/u acutely per POC.   SLP Visit Diagnosis: Dysphagia, unspecified (R13.10)    Aspiration Risk  Moderate aspiration risk    Diet Recommendation NPO;Ice chips PRN after oral care   Liquid Administration via: Spoon Medication Administration: Via alternative means Supervision: Full supervision/cueing for compensatory strategies Compensations: Small sips/bites;Slow rate;Minimize environmental distractions Postural Changes: Seated upright at 90 degrees;Remain upright for at least  30 minutes after po intake    Other  Recommendations Oral Care Recommendations: Oral care QID;Staff/trained caregiver to provide oral care;Oral care prior to ice chip/H20 Other Recommendations: Have oral suction  available   Follow up Recommendations Skilled Nursing facility;24 hour supervision/assistance      Frequency and Duration min 2x/week  2 weeks       Prognosis Prognosis for Safe Diet Advancement: Fair Barriers to Reach Goals: Cognitive deficits;Behavior      Swallow Study   General HPI: Jordan Sawyer is a 74 year old male with past medical history significant for dementia, CVA in June 2020, COPD, HTN, GERD, hypothyroidism, tobacco use disorder presents today after sustaining multiple falls and jerking. Per chart review he was admitted to the hospital from December 15, 2018 to January 14, 2019 for an acute lacunar stroke involving the right brain.  He received TPA and unfortunately had a small right 67 mm thalamic post TPA hemorrhagic conversion without cytotoxic edema or intraventricular extension.  Pt completed a MBS on 12/17/18 which reported functional oropharyngeal dysphagia with suspected primary esophageal dysphagia.   Type of Study: Bedside Swallow Evaluation Previous Swallow Assessment: See HPI  Diet Prior to this Study: NPO Temperature Spikes Noted: Yes Respiratory Status: Room air History of Recent Intubation: No Behavior/Cognition: Alert;Requires cueing;Distractible;Impulsive;Confused Oral Cavity Assessment: Dry;Dried secretions Oral Care Completed by SLP: Yes Oral Cavity - Dentition: Edentulous Self-Feeding Abilities: Total assist Patient Positioning: Upright in bed Baseline Vocal Quality: Low vocal intensity Volitional Cough: Strong Volitional Swallow: Unable to elicit    Oral/Motor/Sensory Function Overall Oral Motor/Sensory Function: Mild impairment Facial ROM: Reduced left Facial Symmetry: Abnormal symmetry left Facial Sensation: Within Functional Limits Lingual ROM: Within Functional Limits Lingual Symmetry: Within Functional Limits   Ice Chips Ice chips: Impaired Presentation: Spoon Oral Phase Impairments: Impaired mastication;Poor awareness of bolus;Reduced lingual  movement/coordination Oral Phase Functional Implications: Prolonged oral transit   Thin Liquid Thin Liquid: Impaired Presentation: Spoon;Straw Pharyngeal  Phase Impairments: Cough - Immediate    Nectar Thick Nectar Thick Liquid: Not tested   Honey Thick Honey Thick Liquid: Not tested   Puree Puree: Impaired Presentation: Spoon Oral Phase Impairments: Poor awareness of bolus;Reduced lingual movement/coordination Oral Phase Functional Implications: Oral holding;Prolonged oral transit Pharyngeal Phase Impairments: Other (comments)(No swallow initiation attempted )   Solid     Solid: Not tested     Colin Mulders M.S., CCC-SLP Acute Rehabilitation Services Office: [phone]  Elvia Collum Faithe Ariola 06/20/2019,9:05 AM

## 2019-06-20 NOTE — Progress Notes (Signed)
Pt was found trying to get out of bed, pulling off his gown, and pulling the telemetry leads off. Pt was placed in safety mitts for patient safety and will continue to monitor.

## 2019-06-20 NOTE — Progress Notes (Signed)
RN was notified pt was off of telemetry by [employer]. RN checked on patient and patient was climbing out of bed, pulled off external catheter and telemetry leads. Safety mitts were still on the patient.  Pt was cursing at staff and stating he was going to go home. MD Marchia Bond was paged about agitation, orders received were Haldol 2mg  IM, if that doesn't work to call MD Coe back. Will continue to monitor.

## 2019-06-20 NOTE — Progress Notes (Signed)
Subjective:   Pt seen at the bedside this AM. He was comfortable lying in bed. He indicated that he is doing pretty well and better than yesterday. He still appears somewhat confused, but much improved from yesterday.   Objective:  Vital signs in last 24 hours: Vitals:   06/19/19 1725 06/19/19 2106 06/19/19 2232 06/19/19 2300  BP:  (!) 143/118  (!) 144/99  Pulse: 91 80  80  Resp:  (!) 23  20  Temp:  97.7 F (36.5 C)  97.7 F (36.5 C)  TempSrc:  Oral  Axillary  SpO2:  100%  100%  Weight:  80.5 kg    Height:   5\' 10"  (1.778 m)    Physical Exam General: Resting in bed comfortably, NAD HEENT: NCAT CV: RRR, no MRG appreciated PULM: CTAB, no crackles or wheezes ABD: Soft and nontender in all quadrants NEURO: Alert and oriented to self, but not to place or month. States he is the hospital at [facility] and the month is February. Moves all extremities antigravity. No gross focal deficits   Assessment/Plan:  Active Problems:   Agitation   Tobacco abuse   Frequent falls   Neurological movement disorder  In summary, Mr. Welty is a 74 year old male with past medical history significant for dementia, CVA in June 2020, COPD, HTN, GERD, hypothyroidism, tobacco use disorder presents today after sustaining multiple falls over the last several months, more frequently this past week, upper extremity myoclonic jerking. At this time, the patient is not tolerating p.o. well per SLP evaluation.  We will switch as many p.o. medications to IV as possible today.  #Hx CVA #Multiple Falls #Myoclonic jerks: Neurology saw the patient in the ED. CT of the the head did not show an acute process.Will appreciate recommendations.  At this time, it is unclear what is causing the patient to have increased falls and myoclonic jerks. EEG overnight did not demonstrate any evidence of seizures. B12, TSH, ammonia levels WNL. CT chest, abdomen and pelvis was done on admission and did not show evidence of  infection, but did demonstrate L 11th rib fx. of the chest  - Appreciate Neurology recommendations             -MRI Brain ordered, will likely need sedation  -CT C-spine ordered  -Start Klonopin 0.25 mg disintegrating tablets BID for myoclonic jerks  -f/u blood cultures, low suspicion for infection              -PT/OT/SLP consulted, will appreciate reccomendations -Aspirin 81 mg daily  -Orthostatic vitals ordered  #Dementia #Aggitation: Although the patient was calm during our evaluation this morning, he had some more agitation overnight and received Haldol 2 mg.  -Attempt redirection -Soft mittens -D/c home venlafaxine   #L 11th rib fx: Caused by fall prior to this hospitalization.  Confirmed by CT of the chest on admission. Pt states pain is well controlled this AM. -Norco 10-325 mg every 6 hours as needed  #COPD #Tobacco abuse -Nicotine patch 21 mg  #HTN: Patient takes lisinopril milligrams daily at home -Home BP meds in the setting of falls and poor PO intake  #Hypothyroidism: Pt takes synthroid 100 mcg at home. - change to IV Synthroid 50 mcg  #GERD -IV Protonix 40 mg   #FEN/GI -Diet: NPO, per SLP evaluation -Fluids: none   #DVT prophylaxis -SCDs  #CODE STATUS: FULL  #Dispo: Pending medical course. Pt will likely need SNF placement upon d/c. CSW consulted.  Earlene Plater, MD Internal Medicine, PGY1  Pager: [phone]  06/20/2019,11:05 AM

## 2019-06-20 NOTE — Progress Notes (Addendum)
Neurology Progress Note   S:// Patient seen and examined Remains agitated.  Had jerky movements ongoing overnight. I did not witness any jerking movements while I was examining him but as I was about to enter the room, he did have whole body myoclonic jerks looking movement.   O:// Current vital signs: BP (!) 156/93   Pulse 98   Temp 98.2 F (36.8 C) (Oral)   Resp 20   Ht 5\' 10"  (1.778 m)   Wt 80.5 kg   SpO2 100%   BMI 25.46 kg/m  Vital signs in last 24 hours: Temp:  [97.7 F (36.5 C)-99.3 F (37.4 C)] 98.2 F (36.8 C) (01/03 0751) Pulse Rate:  [80-98] 98 (01/03 0751) Resp:  [18-23] 20 (01/03 0751) BP: (103-176)/(70-118) 156/93 (01/03 0751) SpO2:  [97 %-100 %] 100 % (01/02 2300) Weight:  [80.5 kg] 80.5 kg (01/02 2106) Neurological exam He is awake alert oriented to self and the fact that he is in the hospital. Not oriented to time Speech is severely dysarthric No evidence of aphasia but poor attention concentration Cranial nerves: Pupils equal round reactive light, extraocular movements intact, face appears symmetric, tongue and palate midline. Motor examination: Antigravity in both upper extremities 4+/5.  Antigravity in right lower extremity 4/5.  Barely antigravity in left lower extremity with 3/5.  No cogwheeling. Sensory exam: Intact to light touch Coordination: No gross dysmetria DTRs: Hyperreflexic in both lower extremities and 2+ symmetric biceps bilaterally.  Medications  Current Facility-Administered Medications:  .  aspirin EC tablet 81 mg, 81 mg, Oral, Daily, Agyei, Obed K, MD .  atorvastatin (LIPITOR) tablet 20 mg, 20 mg, Oral, Daily, Earlene Plater, MD .  enoxaparin (LOVENOX) injection 40 mg, 40 mg, Subcutaneous, Q24H, Agyei, Obed K, MD .  HYDROcodone-acetaminophen (NORCO) 10-325 MG per tablet 1 tablet, 1 tablet, Oral, Q6H PRN, Earlene Plater, MD .  lamoTRIgine (LAMICTAL) tablet 200 mg, 200 mg, Oral, Daily, Earlene Plater, MD .  levothyroxine  (SYNTHROID) tablet 100 mcg, 100 mcg, Oral, Q0600, Lucious Groves, DO .  LORazepam (ATIVAN) injection 1 mg, 1 mg, Intravenous, PRN, Earlene Plater, MD, 1 mg at 06/19/19 2147 .  MEDLINE mouth rinse, 15 mL, Mouth Rinse, BID, Hoffman, Erik C, DO .  nicotine (NICODERM CQ - dosed in mg/24 hours) patch 21 mg, 21 mg, Transdermal, Daily, Earlene Plater, MD .  pantoprazole (PROTONIX) EC tablet 40 mg, 40 mg, Oral, Daily, Earlene Plater, MD .  potassium chloride 10 mEq in 100 mL IVPB, 10 mEq, Intravenous, Q1 Hr x 6, Earlene Plater, MD, Last Rate: 100 mL/hr at 06/20/19 0655, 10 mEq at 06/20/19 0655 .  potassium chloride SA (KLOR-CON) CR tablet 30 mEq, 30 mEq, Oral, Once, Agyei, Obed K, MD .  sodium chloride flush (NS) 0.9 % injection 3 mL, 3 mL, Intravenous, Once, Earlene Plater, MD .  sodium chloride flush (NS) 0.9 % injection 3 mL, 3 mL, Intravenous, Q12H, Earlene Plater, MD, 3 mL at 06/19/19 2148 .  venlafaxine XR (EFFEXOR-XR) 24 hr capsule 150 mg, 150 mg, Oral, Q breakfast, Earlene Plater, MD Labs CBC    Component Value Date/Time   WBC 7.7 06/20/2019 0454   RBC 4.16 (L) 06/20/2019 0454   HGB 12.4 (L) 06/20/2019 0454   HCT 37.1 (L) 06/20/2019 0454   PLT 265 06/20/2019 0454   MCV 89.2 06/20/2019 0454   MCH 29.8 06/20/2019 0454   MCHC 33.4 06/20/2019 0454   RDW 13.1 06/20/2019 0454    CMP     Component  Value Date/Time   NA 138 06/20/2019 0454   K 3.4 (L) 06/20/2019 0454   CL 105 06/20/2019 0454   CO2 22 06/20/2019 0454   GLUCOSE 80 06/20/2019 0454   BUN 17 06/20/2019 0454   CREATININE 1.08 06/20/2019 0454   CALCIUM 8.7 (L) 06/20/2019 0454   PROT 6.4 (L) 06/19/2019 0927   ALBUMIN 3.2 (L) 06/19/2019 0927   AST 24 06/19/2019 0927   ALT 16 06/19/2019 0927   ALKPHOS 115 06/19/2019 0927   BILITOT 1.4 (H) 06/19/2019 0927   GFRNONAA >60 06/20/2019 0454   GFRAA >60 06/20/2019 0454   Lab studies-CMP as above TSH 1.172 B12 434 Ammonia 25 Thiamine pending Urinalysis  unremarkable for infection. Blood culture test pending  Imaging I have reviewed images in epic and the results pertinent to this consultation are: MRI pending CT C-spine pending   Assessment: 74 year old history of hypertension, prior stroke with residual left-sided deficits, hypothyroidism presenting with progressive gait difficulty, increasing falls and a whole body jerking movements concerning for myoclonus. Exam reveals left mild hemiparesis more so in the leg. Laboratory studies thus far have been unremarkable. EEG, which captured these myoclonic jerks, is not suggestive of underlying seizure activity. Differentials include metabolic derangements (although labs are pretty unremarkable) versus post stroke movement disorders as seen with strokes in the subthalamic nuclei.  A new stroke cannot be completely ruled out as a cause of his falls. His exam does not have any increased tone or cogwheeling to suggest parkinsonian etiology   Recommendations -MR brain when able to-might need to be sedated -CT of the C-spine when able to-if he is going to be sedated for the MRI brain, then you can obtain an MRI of the C-spine at that time as well in lieu of the CT. -Can start Klonopin at very low-dose to see if that helps with the myoclonic jerking. -Other medications that might help would be Depakote but given his mildly deranged bilirubin, I would be reluctant to use that.  Keppra would be another alternative but that can also cause agitation and he already appears very agitated and hence Klonopin might be the best option to start. -Chest x-ray was not done till date-please obtain chest x-ray.  Urinalysis has been unremarkable for source for infection-UTIs and pneumonia/respiratory infections can present in different ways in elderly including frequent falls, -PT/OT/speech therapy -Continue B12 and thiamine supplementation -Follow-up the thiamine level to see if there is any component of Wernicke's  encephalopathy. -Social work and case management consult for placement  We will follow with you once the above tests become available  -- Milon Dikes, MD [facility] Pager: [phone] If 7pm to 7am, please call on call as listed on AMION.

## 2019-06-20 NOTE — Progress Notes (Signed)
PT Cancellation Note  Patient Details Name: Jordan Sawyer MRN: [id number] DOB: Nov 08, 1945   Cancelled Treatment:    Reason Eval/Treat Not Completed: Active bedrest order   Laurina Bustle, PT, DPT Acute Rehabilitation Services Pager [phone] Office [phone]    [name] 06/20/2019, 7:49 AM

## 2019-06-20 NOTE — Progress Notes (Addendum)
2239: Pt was able to take dissolving scheduled Klonopin at 2127 - had been moaning, restless. On call IM resident night was paged re: IV pain medication - pt is NPO, inability to swallow. Pt had continued to yell out, "help me help me" and says "I'm in pain" but is unable to describe nor locate pain.   2359: Spoke to pt's daughter per phone; she wants to find SNF near [place]

## 2019-06-20 NOTE — Progress Notes (Signed)
Internal Medicine Attending:   I saw and examined the patient. I reviewed the resident's note and I agree with the resident's findings and plan as documented in the resident's note.  Continues to have some myoclonic jerking.  Less agitated this morning on our evaluation.  Appreciate neurology following.  We will continue vitamin B12 and thiamine supplementation follow-up thiamine levels.  We will work to obtain MRI and CT of the C-spine.  Start low-dose Klonopin. Infectious work-up is so far been negative we will, negative UA, no infiltrate seen on chest CT so no need for chest x-ray.

## 2019-06-20 NOTE — Plan of Care (Signed)
Imaging reviewed. MRI brain with no acute findings but chronic findings of the thalamic strokes. CT C-spine also unremarkable.  I suspect strongly that his abnormal jerking movements at this time are related to toxic metabolic derangements as well as are a sequelae of the stroke.  As outlined in today's note, Klonopin should be tried first.   Second line-Depakote should be tried. Third line: Keppra can also be tried if the above 2 are not sufficient to control.  From a neurological standpoint, he needs to be in rehabilitation order skilled nursing facility as deemed appropriate by physical therapy recommendations where he can get the appropriate care as family is unable to take care of him.  He also needs a outpatient follow-up with neurology.  -- Milon Dikes, MD [facility] Pager: [phone] If 7pm to 7am, please call on call as listed on AMION.

## 2019-06-21 DIAGNOSIS — S2232XA Fracture of one rib, left side, initial encounter for closed fracture: Secondary | ICD-10-CM | POA: Diagnosis present

## 2019-06-21 DIAGNOSIS — I700 Atherosclerosis of aorta: Secondary | ICD-10-CM | POA: Diagnosis present

## 2019-06-21 DIAGNOSIS — E785 Hyperlipidemia, unspecified: Secondary | ICD-10-CM

## 2019-06-21 LAB — BASIC METABOLIC PANEL
Anion gap: 10 (ref 5–15)
BUN: 18 mg/dL (ref 8–23)
CO2: 22 mmol/L (ref 22–32)
Calcium: 8.4 mg/dL — ABNORMAL LOW (ref 8.9–10.3)
Chloride: 104 mmol/L (ref 98–111)
Creatinine, Ser: 0.97 mg/dL (ref 0.61–1.24)
GFR calc Af Amer: >60 mL/min (ref >60)
GFR calc non Af Amer: >60 mL/min (ref >60)
Glucose, Bld: 81 mg/dL (ref 70–99)
Potassium: 3.8 mmol/L (ref 3.5–5.1)
Sodium: 136 mmol/L (ref 135–145)

## 2019-06-21 LAB — CBC
HCT: 37.6 % — ABNORMAL LOW (ref 39.0–52.0)
Hemoglobin: 12.3 g/dL — ABNORMAL LOW (ref 13.0–17.0)
MCH: 29.9 pg (ref 26.0–34.0)
MCHC: 32.7 g/dL (ref 30.0–36.0)
MCV: 91.5 fL (ref 80.0–100.0)
Platelets: 267 10*3/uL (ref 150–400)
RBC: 4.11 MIL/uL — ABNORMAL LOW (ref 4.22–5.81)
RDW: 13 % (ref 11.5–15.5)
WBC: 6.1 10*3/uL (ref 4.0–10.5)
nRBC: 0 % (ref 0.0–0.2)

## 2019-06-21 LAB — GLUCOSE, CAPILLARY: Glucose-Capillary: 81 mg/dL (ref 70–99)

## 2019-06-21 MED ORDER — KETOROLAC TROMETHAMINE 30 MG/ML IJ SOLN
30.0000 mg | Freq: Once | INTRAMUSCULAR | Status: AC
  Administered 2019-06-21: 30 mg via INTRAVENOUS
  Filled 2019-06-21: qty 1

## 2019-06-21 MED ORDER — AMLODIPINE BESYLATE 5 MG PO TABS
5.0000 mg | ORAL_TABLET | Freq: Every day | ORAL | Status: DC
  Administered 2019-06-21 – 2019-07-01 (×11): 5 mg via ORAL
  Filled 2019-06-21 (×11): qty 1

## 2019-06-21 NOTE — TOC Initial Note (Signed)
Transition of Care [facility]) - Initial/Assessment Note    Patient Details  Name: Jordan Sawyer MRN: [id number] Date of Birth: May 08, 1946  Transition of [facility] CM/SW Contact:    Kermit Balo, RN Phone Number: 06/21/2019, 3:19 PM  Clinical Narrative:                 Recommendations are for SNF. CM reached out to patients family and they are in agreement. Family would like [facility] but open to other options. They are agreeable to him being faxed out in the [place] area.  PASAR went to manual review d/t anxiety. Will get 30 day note signed.  TOC following.  Expected Discharge Plan: Skilled Nursing Facility Barriers to Discharge: Continued Medical Work up   Patient Goals and CMS Choice   CMS Medicare.gov Compare Post Acute Care list provided to:: Patient Represenative (must comment) Choice offered to / list presented to : Spouse, Adult Children  Expected Discharge Plan and Services Expected Discharge Plan: Skilled Nursing Facility In-house Referral: Clinical Social Work Discharge Planning Services: CM Consult Post Acute Care Choice: Skilled Nursing Facility Living arrangements for the past 2 months: Single Family Home                                      Prior Living Arrangements/Services Living arrangements for the past 2 months: Single Family Home Lives with:: Spouse Patient language and need for interpreter reviewed:: Yes        Need for Family Participation in Patient Care: Yes (Comment) Care giver support system in place?: No (comment)   Criminal Activity/Legal Involvement Pertinent to Current Situation/Hospitalization: No - Comment as needed  Activities of Daily Living Home Assistive Devices/Equipment: Wheelchair ADL Screening (condition at time of admission) Patient's cognitive ability adequate to safely complete daily activities?: No Is the patient deaf or have difficulty hearing?: No Does the patient have difficulty seeing, even when wearing  glasses/contacts?: No Does the patient have difficulty concentrating, remembering, or making decisions?: Yes Patient able to express need for assistance with ADLs?: Yes Does the patient have difficulty dressing or bathing?: Yes Independently performs ADLs?: No Does the patient have difficulty walking or climbing stairs?: Yes Weakness of Legs: Left Weakness of Arms/Hands: Left  Permission Sought/Granted                  Emotional Assessment       Orientation: : Oriented to Self, Oriented to Place, Oriented to  Time   Psych Involvement: No (comment)  Admission diagnosis:  Cough [R05] Weakness [R53.1] Myoclonic jerking [G25.3] Frequent falls [R29.6] Patient Active Problem List   Diagnosis Date Noted  . Closed traumatic minimally displaced fracture of one rib of left side 06/21/2019  . Aortic atherosclerosis (HCC) 06/21/2019  . Frequent falls 06/19/2019  . Neurological movement disorder 06/19/2019  . ICH (intracerebral hemorrhage) (HCC) s/p tPA 12/18/2018  . Agitation 12/18/2018  . Essential hypertension 12/18/2018  . Hyperlipidemia 12/18/2018  . Tobacco abuse 12/18/2018  . Hypothyroidism 12/18/2018  . COPD (chronic obstructive pulmonary disease) (HCC) 12/18/2018  . Anxiety 12/18/2018  . BPH (benign prostatic hyperplasia) 12/18/2018  . Hypokalemia 12/18/2018  . Stroke (cerebrum) [facility]) - R brain s/p tPA, due to small vessel disease 12/15/2018   PCP:  Hortencia Conradi, NP Pharmacy:   [facility] - [place], [place] - [address] [place] [address] Phone:  [phone] Fax: [phone]     Social Determinants of Health (SDOH) Interventions    Readmission Risk Interventions Readmission Risk Prevention Plan 12/18/2018  Post Dischage Appt Not Complete  Appt Comments Office closed on 7/3  Medication Screening Complete  Transportation Screening Complete  Some recent data might be hidden

## 2019-06-21 NOTE — Progress Notes (Signed)
Subjective:   Pt seen at the bedside this AM. He reports 8/10 left back pain this morning. He says he does not know what is going on and he wants to get out of here. He thinks he may be in the hospital but is surprised to hear he is in [place] in [place]. Repeats he does not understand. We discussed trying to get his pain under better control today.  Objective:  MRI Brain: IMPRESSION: No acute intracranial finding. Advanced chronic small-vessel ischemic changes throughout the brain as outlined above. Old small vessel infarctions of the right thalamus and right posterior basal ganglia/external capsule. Hemosiderin deposition associated with the old right thalamic infarction. No evidence of recent hemorrhage.  Vital signs in last 24 hours: Vitals:   06/19/19 2300 06/20/19 0751 06/20/19 1654 06/20/19 2309  BP: (!) 144/99 (!) 156/93 (!) 156/97 (!) 160/99  Pulse: 80 98 84 93  Resp: 20 20 20 20   Temp: 97.7 F (36.5 C) 98.2 F (36.8 C) 97.7 F (36.5 C) 98.4 F (36.9 C)  TempSrc: Axillary Oral  Oral  SpO2: 100%  98% 98%  Weight:      Height:       Physical Exam General: Elderly appearing male lying in bed in some apparent discomfort HEENT: NCAT CV: Normal S1-S2 no murmurs rubs or gallops appreciated PULM: Clear to auscultation bilaterally, no crackles or wheezes appreciated ABD: Soft and nontender in all quadrants Skin: Left axillary line in flank bruising NEURO: alert, but not oriented. Repeatedly asks where he is.  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  #Hx CVA #Multiple Falls #Myoclonic jerks:  Neurology has been following the patient since admission. Myoclonic jerking has improved since adding Klonopin 0.25 mg twice daily. Likely secondary to toxic metabolic encephalopathy that should improve over time. EEG did not demonstrate any evidence of seizures. CT chest, abdomen and pelvis was done on admission and did not show evidence of infection, but did demonstrate L 11th rib fx. of the chest. to falls.  - Appreciate Neurology recommendations, signing off today             -MRI Brain showed no acute intracranial findings, but was significant for chronic small vessel ischemic changes throughout the brain as well as old small vessel infarctions of the right thalamus and right posterior basal ganglia  -Continue Klonopin 0.25 mg disintegrating tablets BID for myoclonic jerks.  Patient will need to follow-up in outpatient setting             -PT/OT/SLP consulted, will appreciate recommendations.  Will likely need SNF placement. -Aspirin 81 mg daily  -Orthostatic vitals ordered  #L 11th rib fx: Caused by fall prior to this hospitalization.  Confirmed by CT of the chest on admission. Pt states pain is well controlled this AM. -Toradol 30 mg injection once -Dilaudid 0.5 mg every 4 hours as needed   #Dementia #Aggitation: As we walked past the patient's room this morning, he was yelling out "help me please help me".  Not oriented. -Attempt redirection -Soft mittens -  Ativan 1mg  PRN  #COPD #Tobacco abuse -Nicotine patch 21 mg  #HTN: Patient takes lisinopril milligrams daily at home -Amlodipine 5 mg daily  #HLD -Lipitor 20 mg daily  #Hypothyroidism: Pt takes synthroid 100 mcg at home. -IV Synthroid 50 mcg  #GERD -IV Protonix 40 mg   #FEN/GI -Diet: NPO, per SLP evaluation -Fluids: none   #DVT prophylaxis -SCDs  #CODE STATUS: FULL  #Dispo: Pending medical  course. Pt will likely need SNF placement upon d/c. Family wants the SNF to be in [place]. CSW consulted.  [place], MD Internal Medicine, PGY1 Pager: [phone]  06/21/2019,10:28 AM

## 2019-06-21 NOTE — NC FL2 (Signed)
[place] MEDICAID FL2 LEVEL OF CARE SCREENING TOOL     IDENTIFICATION  Patient Name: Jordan Sawyer Birthdate: 1946-02-14 Sex: male Admission Date (Current Location): 06/19/2019  [facility] and [place] Number:  [employer] and Address:  [facility], [address] [place], [place] [address]      Provider Number: [id number]  Attending Physician Name and Address:  Gust Rung, DO  Relative Name and Phone Number:       Current Level of Care: Hospital Recommended Level of Care: Skilled Nursing Facility Prior Approval Number:    Date Approved/Denied:   PASRR Number: manual review  Discharge Plan: SNF    Current Diagnoses: Patient Active Problem List   Diagnosis Date Noted  . Closed traumatic minimally displaced fracture of one rib of left side 06/21/2019  . Aortic atherosclerosis (HCC) 06/21/2019  . Frequent falls 06/19/2019  . Neurological movement disorder 06/19/2019  . ICH (intracerebral hemorrhage) (HCC) s/p tPA 12/18/2018  . Agitation 12/18/2018  . Essential hypertension 12/18/2018  . Hyperlipidemia 12/18/2018  . Tobacco abuse 12/18/2018  . Hypothyroidism 12/18/2018  . COPD (chronic obstructive pulmonary disease) (HCC) 12/18/2018  . Anxiety 12/18/2018  . BPH (benign prostatic hyperplasia) 12/18/2018  . Hypokalemia 12/18/2018  . Stroke (cerebrum) (HCC) - R brain s/p tPA, due to small vessel disease 12/15/2018    Orientation RESPIRATION BLADDER Height & Weight     Self, Time, Place  Normal Incontinent Weight: 80.5 kg Height:  5\' 10"  (177.8 cm)  BEHAVIORAL SYMPTOMS/MOOD NEUROLOGICAL BOWEL NUTRITION STATUS      Continent Diet(dysphagia 3 with thins)  AMBULATORY STATUS COMMUNICATION OF NEEDS Skin   Extensive Assist Verbally Skin abrasions, Bruising(skin tears to bilateral arms/ generalized bruising)                       Personal Care Assistance Level of Assistance  Bathing, Dressing, Feeding Bathing Assistance:  Limited assistance Feeding assistance: Limited assistance Dressing Assistance: Limited assistance     Functional Limitations Info  Sight, Hearing, Speech Sight Info: Adequate Hearing Info: Adequate Speech Info: Impaired(dysarthria)    SPECIAL CARE FACTORS FREQUENCY  PT (By licensed PT), OT (By licensed OT), Speech therapy     PT Frequency: 5x/wk OT Frequency: 5x/wk     Speech Therapy Frequency: 5x/wk      Contractures Contractures Info: Not present    Additional Factors Info  Code Status, Allergies, Psychotropic Code Status Info: Full Allergies Info: NKA Psychotropic Info: Klonopin 0.25 mg PO BID/ Lamictal 200 mg daily PO         Current Medications (06/21/2019):  This is the current hospital active medication list Current Facility-Administered Medications  Medication Dose Route Frequency Provider Last Rate Last Admin  . amLODipine (NORVASC) tablet 5 mg  5 mg Oral Daily 08/19/2019, MD   5 mg at 06/21/19 0854  . aspirin EC tablet 81 mg  81 mg Oral Daily 08/19/19, MD   81 mg at 06/21/19 0854  . atorvastatin (LIPITOR) tablet 20 mg  20 mg Oral Daily 08/19/19, MD   20 mg at 06/21/19 0854  . clonazePAM (KLONOPIN) disintegrating tablet 0.25 mg  0.25 mg Oral BID 08/19/19, MD   0.25 mg at 06/21/19 0817  . enoxaparin (LOVENOX) injection 40 mg  40 mg Subcutaneous Q24H 08/19/19, MD   40 mg at 06/20/19 2127  . HYDROmorphone (DILAUDID) injection 0.5 mg  0.5 mg Intravenous Q4H PRN 2128,  Rodman Key, MD   0.5 mg at 06/20/19 2316  . lactated ringers infusion   Intravenous Continuous Earlene Plater, MD 100 mL/hr at 06/21/19 0344 New Bag at 06/21/19 0344  . lamoTRIgine (LAMICTAL) tablet 200 mg  200 mg Oral Daily Earlene Plater, MD   200 mg at 06/21/19 0904  . levothyroxine (SYNTHROID, LEVOTHROID) injection 50 mcg  50 mcg Intravenous Daily Earlene Plater, MD   50 mcg at 06/21/19 1050  . MEDLINE mouth rinse  15 mL Mouth Rinse BID Joni Reining C, DO   15  mL at 06/21/19 0829  . nicotine (NICODERM CQ - dosed in mg/24 hours) patch 21 mg  21 mg Transdermal Daily Earlene Plater, MD   21 mg at 06/21/19 0949  . pantoprazole (PROTONIX) injection 40 mg  40 mg Intravenous Q24H Earlene Plater, MD   40 mg at 06/21/19 0845  . sodium chloride flush (NS) 0.9 % injection 3 mL  3 mL Intravenous Once Earlene Plater, MD      . sodium chloride flush (NS) 0.9 % injection 3 mL  3 mL Intravenous Q12H Earlene Plater, MD   3 mL at 06/21/19 3005     Discharge Medications: Please see discharge summary for a list of discharge medications.  Relevant Imaging Results:  Relevant Lab Results:   Additional Information SS#: [id number]  Pollie Friar, RN

## 2019-06-21 NOTE — Evaluation (Signed)
Physical Therapy Evaluation Patient Details Name: Jordan Sawyer MRN: [id number] DOB: 1946/04/26 Today's Date: 06/21/2019   History of Present Illness  Pt is a 74 yo male presenting following a fall at home. The pt PMH is sig for dementia, CVA June 2020, COPD, HTN, GERD, hypothyroidism, and frequent falls. The pt sustained a fx to the 11th rib in his most recent fall.  Clinical Impression  Pt in bed upon PT arrival, agreeable to PT evaluation at this time. The pt demonstrates good bed mobility and decent strength, but demos sig balance deficits due to PMH described above. The pt demos sig posterior lean upon standing, which progressively worsens with fatigue from ambulation requiring min/modA. The pt was able to complete 3 x 20 ft bouts of ambulation, VSS other than HR in 130s upon standing, with use of RW and min/modA to maintain balance. The pt also completed 5XSTS from EOB with modA for balance, improved technique with use of VCs. The pt will continue to benefit from skilled PT both in hospital as well as prior to d/c home to maximize safety and independence.    Follow Up Recommendations SNF    Equipment Recommendations  Other (comment)(defer)    Recommendations for Other Services       Precautions / Restrictions Precautions Precautions: Fall Precaution Comments: history of frequent falls, posterior lean increases with fatigue Restrictions Weight Bearing Restrictions: No      Mobility  Bed Mobility Overal bed mobility: Needs Assistance Bed Mobility: Supine to Sit;Sit to Supine     Supine to sit: Supervision Sit to supine: Supervision   General bed mobility comments: supervision for safety, no assist needed  Transfers Overall transfer level: Needs assistance Equipment used: Rolling walker (2 wheeled) Transfers: Sit to/from Stand Sit to Stand: Mod assist         General transfer comment: no assist to rise, but modA to maintain standing balance with RW. pt with sig  posterior lean. poor eccentruc control when lowering, improved with VCs for hand placement.  Ambulation/Gait Ambulation/Gait assistance: Mod assist;Min assist Gait Distance (Feet): 20 Feet(20 ft x3. minA initially, progresses to modA with fatigue) Assistive device: Rolling walker (2 wheeled) Gait Pattern/deviations: Narrow base of support;Decreased stride length;Step-to pattern;Leaning posteriorly Gait velocity: decr Gait velocity interpretation: <1.31 ft/sec, indicative of household ambulator General Gait Details: Pt with sig posterior lean that increases with fatigue. poor control of RW, stands too close but does not adjust even with VCs. no LOB, but min/modA to maintain upright  Stairs            Wheelchair Mobility    Modified Rankin (Stroke Patients Only) Modified Rankin (Stroke Patients Only) Pre-Morbid Rankin Score: No symptoms Modified Rankin: Moderately severe disability     Balance Overall balance assessment: Needs assistance Sitting-balance support: Bilateral upper extremity supported;Feet supported Sitting balance-Leahy Scale: Good Sitting balance - Comments: modI static sitting   Standing balance support: Bilateral upper extremity supported;During functional activity Standing balance-Leahy Scale: Poor Standing balance comment: reliant on BUE, modA to maintain due to poor reactions as well as sig post lean                             Pertinent Vitals/Pain Pain Assessment: No/denies pain Faces Pain Scale: No hurt Pain Intervention(s): Monitored during session    Home Living Family/patient expects to be discharged to:: Private residence Living Arrangements: Spouse/significant other Available Help at Discharge: Family Type of Home: House Home  Access: Level entry     Home Layout: One level Home Equipment: Walker - 2 wheels;Cane - single point;Shower seat;Grab bars - toilet;Hand held shower head;Wheelchair - manual Additional Comments: Wife can  drive, but daughter and grandson do most errands/driving    Prior Function Level of Independence: Independent with assistive device(s)(pt reports he does most himself, wife helps some)         Comments: uses walker vs cane, history of falls     Hand Dominance   Dominant Hand: Right    Extremity/Trunk Assessment   Upper Extremity Assessment Upper Extremity Assessment: Overall WFL for tasks assessed    Lower Extremity Assessment Lower Extremity Assessment: Overall WFL for tasks assessed;LLE deficits/detail;RLE deficits/detail RLE Deficits / Details: BLE with myoclonic movements during ambulation, pt with impaired control/coordination LLE Deficits / Details: BLE with myoclonic movements during ambulation, pt with impaired control/coordination    Cervical / Trunk Assessment Cervical / Trunk Assessment: Normal  Communication   Communication: No difficulties  Cognition Arousal/Alertness: Awake/alert Behavior During Therapy: WFL for tasks assessed/performed Overall Cognitive Status: Within Functional Limits for tasks assessed                                        General Comments      Exercises     Assessment/Plan    PT Assessment Patient needs continued PT services  PT Problem List Decreased strength;Decreased mobility;Decreased safety awareness;Decreased range of motion;Decreased coordination;Decreased activity tolerance;Decreased balance       PT Treatment Interventions DME instruction;Therapeutic exercise;Gait training;Balance training;Functional mobility training;Therapeutic activities;Patient/family education    PT Goals (Current goals can be found in the Care Plan section)  Acute Rehab PT Goals Patient Stated Goal: return home PT Goal Formulation: With patient Time For Goal Achievement: 07/05/19 Potential to Achieve Goals: Fair    Frequency Min 3X/week   Barriers to discharge Decreased caregiver support pt reports wife is "feeble" and  unable to assist him    Co-evaluation               AM-PAC PT "6 Clicks" Mobility  Outcome Measure Help needed turning from your back to your side while in a flat bed without using bedrails?: None Help needed moving from lying on your back to sitting on the side of a flat bed without using bedrails?: None Help needed moving to and from a bed to a chair (including a wheelchair)?: A Little Help needed standing up from a chair using your arms (e.g., wheelchair or bedside chair)?: A Little Help needed to walk in hospital room?: A Lot Help needed climbing 3-5 steps with a railing? : A Lot 6 Click Score: 18    End of Session Equipment Utilized During Treatment: Gait belt Activity Tolerance: Patient tolerated treatment well Patient left: in bed;with call bell/phone within reach;with bed alarm set;with nursing/sitter in room(in chair position, no recliner in his room) Nurse Communication: Mobility status PT Visit Diagnosis: Unsteadiness on feet (R26.81);Repeated falls (R29.6);History of falling (Z91.81);Hemiplegia and hemiparesis Hemiplegia - Right/Left: Right(right thalamus and right posterior basalganglia/external capsule) Hemiplegia - dominant/non-dominant: Non-dominant Hemiplegia - caused by: Cerebral infarction    Time: 1191-4782 PT Time Calculation (min) (ACUTE ONLY): 43 min   Charges:   PT Evaluation $PT Eval Low Complexity: 1 Low PT Treatments $Gait Training: 23-37 mins        Rolm Baptise, PT, DPT   Acute Rehabilitation Department [phone]  Otho Bellows 06/21/2019, 3:06 PM

## 2019-06-21 NOTE — Progress Notes (Signed)
  Speech Language Pathology Treatment: Dysphagia  Patient Details Name: Jordan Sawyer MRN: [id number] DOB: June 27, 1945 Today's Date: 06/21/2019 Time: 7035-0093 SLP Time Calculation (min) (ACUTE ONLY): 13 min  Assessment / Plan / Recommendation Clinical Impression  Pt appears to have improved mental status from assessment on 11/3.  Pt was able to participate in OME with no focal deficits noted.  With initial trial of thin liquid only there was immediate cough.  With all further trials of thin liquid including serial straw sips, there were no clinical s/s of aspiration.  Pt tolerated puree, soft, and regular solid textures with no overt s/s of aspiration and adequate oral clearance; however, pt did have significantly prolonged oral phase with regular solids. Oral phase was more efficient with soft solids, and pt requested softer foods for ease of mastication.  Recommend mechanical soft diet with thin liquids.    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 Per chart review he was admitted to the hospital from December 15, 2018 to January 14, 2019 for an acute lacunar stroke involving the right brain.  He received TPA and unfortunately had a small right 67 mm thalamic post TPA hemorrhagic conversion without cytotoxic edema or intraventricular extension.  Pt completed a MBS on 12/17/18 which reported functional oropharyngeal dysphagia with suspected primary esophageal dysphagia.        SLP Plan  Continue with current plan of care       Recommendations  Diet recommendations: Dysphagia 3 (mechanical soft);Thin liquid Liquids provided via: Straw;Cup Medication Administration: Whole meds with liquid Supervision: Staff to assist with self feeding Compensations: Small sips/bites;Slow rate;Minimize environmental  distractions Postural Changes and/or Swallow Maneuvers: Seated upright 90 degrees                Oral Care Recommendations: Oral care QID;Staff/trained caregiver to provide oral care Follow up Recommendations: Skilled Nursing facility;24 hour supervision/assistance SLP Visit Diagnosis: Dysphagia, unspecified (R13.10) Plan: Continue with current plan of care       GO                Kerrie Pleasure, MA, CCC-SLP Acute Rehabilitation Services Office: [phone]  06/21/2019, 11:15 AM

## 2019-06-21 NOTE — Progress Notes (Signed)
Reason for consult: Myoclonus, progressive falls  Subjective: Patient is sleeping comfortably in bed.  He is alert and oriented x3 and no longer confused.  Continues to have mittens on, is requesting them to be removed.  Does have some mild dysarthria.   ROS: negative except above   Examination  Vital signs in last 24 hours: Temp:  [97.7 F (36.5 C)-98.4 F (36.9 C)] 98.2 F (36.8 C) (01/04 0812) Pulse Rate:  [84-93] 91 (01/04 0812) Resp:  [20] 20 (01/04 0812) BP: (156-160)/(94-99) 158/94 (01/04 0812) SpO2:  [98 %] 98 % (01/04 0812)  General: lying in bed CVS: pulse-normal rate and rhythm RS: breathing comfortably Extremities: normal   Neuro: MS: Alert, oriented, follows commands CN: pupils equal and reactive,  EOMI, face symmetric, tongue midline, normal sensation over face, Motor: 5/5 strength in all 4 extremities Reflexes:  plantars: flexor Coordination: normal Gait: not tested  Basic Metabolic Panel: Recent Labs  Lab 06/19/19 0729 06/20/19 0454 06/21/19 0454  NA 135 138 136  K 3.4* 3.4* 3.8  CL 101 105 104  CO2 23 22 22   GLUCOSE 99 80 81  BUN 15 17 18   CREATININE 1.23 1.08 0.97  CALCIUM 8.6* 8.7* 8.4*    CBC: Recent Labs  Lab 06/19/19 0729 06/20/19 0454 06/21/19 0454  WBC 8.6 7.7 6.1  HGB 12.5* 12.4* 12.3*  HCT 37.9* 37.1* 37.6*  MCV 90.9 89.2 91.5  PLT 285 265 267     Coagulation Studies: No results for input(s): LABPROT, INR in the last 72 hours.  Imaging Reviewed:     ASSESSMENT AND PLAN  74 year old with prior stroke with residual left-sided deficits presents with progressive gait instability and whole body jerking movements concerning for myoclonus.  EEG captured myoclonic jerks with no electrographic correlate.  Ammonia and BUN normal. MRI brain showed no acute intracranial findings, redemonstrated right thalamic right posterior basal ganglia chronic infarctions.  Patient was started on low-dose Klonopin, currently does not have  any myoclonic jerks on my assessment, which includes addressed as well as initiating movement.   Post stroke myoclonus: resolved  Recommendations Continue Klonopin Continue PT OT evaluation   08/19/19 Baleigh Rennaker [facility] Pager Number [phone] For questions after 7pm please refer to AMION to reach the Neurologist on call

## 2019-06-22 LAB — BASIC METABOLIC PANEL
Anion gap: 8 (ref 5–15)
BUN: 15 mg/dL (ref 8–23)
CO2: 24 mmol/L (ref 22–32)
Calcium: 8.4 mg/dL — ABNORMAL LOW (ref 8.9–10.3)
Chloride: 104 mmol/L (ref 98–111)
Creatinine, Ser: 1.06 mg/dL (ref 0.61–1.24)
GFR calc Af Amer: >60 mL/min (ref >60)
GFR calc non Af Amer: >60 mL/min (ref >60)
Glucose, Bld: 103 mg/dL — ABNORMAL HIGH (ref 70–99)
Potassium: 3.8 mmol/L (ref 3.5–5.1)
Sodium: 136 mmol/L (ref 135–145)

## 2019-06-22 LAB — CBC
HCT: 33.4 % — ABNORMAL LOW (ref 39.0–52.0)
Hemoglobin: 11.3 g/dL — ABNORMAL LOW (ref 13.0–17.0)
MCH: 30.2 pg (ref 26.0–34.0)
MCHC: 33.8 g/dL (ref 30.0–36.0)
MCV: 89.3 fL (ref 80.0–100.0)
Platelets: 238 10*3/uL (ref 150–400)
RBC: 3.74 MIL/uL — ABNORMAL LOW (ref 4.22–5.81)
RDW: 12.9 % (ref 11.5–15.5)
WBC: 5.4 10*3/uL (ref 4.0–10.5)
nRBC: 0 % (ref 0.0–0.2)

## 2019-06-22 LAB — GLUCOSE, CAPILLARY
Glucose-Capillary: 104 mg/dL — ABNORMAL HIGH (ref 70–99)
Glucose-Capillary: 85 mg/dL (ref 70–99)

## 2019-06-22 LAB — VITAMIN B1: Vitamin B1 (Thiamine): 78.3 nmol/L (ref 66.5–200.0)

## 2019-06-22 MED ORDER — LEVOTHYROXINE SODIUM 100 MCG PO TABS
100.0000 ug | ORAL_TABLET | Freq: Every day | ORAL | Status: DC
  Administered 2019-06-23 – 2019-07-01 (×9): 100 ug via ORAL
  Filled 2019-06-22 (×10): qty 1

## 2019-06-22 MED ORDER — HALOPERIDOL LACTATE 5 MG/ML IJ SOLN
1.0000 mg | Freq: Four times a day (QID) | INTRAMUSCULAR | Status: DC | PRN
  Administered 2019-06-22: 1 mg via INTRAVENOUS
  Filled 2019-06-22 (×2): qty 1

## 2019-06-22 MED ORDER — PANTOPRAZOLE SODIUM 40 MG PO TBEC
40.0000 mg | DELAYED_RELEASE_TABLET | Freq: Every day | ORAL | Status: DC
  Administered 2019-06-22 – 2019-07-01 (×10): 40 mg via ORAL
  Filled 2019-06-22 (×10): qty 1

## 2019-06-22 MED ORDER — LISINOPRIL 20 MG PO TABS
20.0000 mg | ORAL_TABLET | Freq: Every day | ORAL | Status: DC
  Administered 2019-06-22 – 2019-07-01 (×10): 20 mg via ORAL
  Filled 2019-06-22 (×10): qty 1

## 2019-06-22 MED ORDER — OXYCODONE HCL 5 MG PO TABS
5.0000 mg | ORAL_TABLET | ORAL | Status: DC | PRN
  Administered 2019-06-24 (×2): 5 mg via ORAL
  Filled 2019-06-22 (×2): qty 1

## 2019-06-22 MED ORDER — LABETALOL HCL 5 MG/ML IV SOLN
5.0000 mg | Freq: Four times a day (QID) | INTRAVENOUS | Status: DC | PRN
  Administered 2019-06-23: 5 mg via INTRAVENOUS
  Filled 2019-06-22: qty 4

## 2019-06-22 NOTE — Progress Notes (Addendum)
Subjective:   Pt seen at the bedside this AM. He was having his physical therapy session and reports that he is doing well and without any complaints. His myoclonic jerks have significantly improved. Pt says he feels much better today than he did when he first came to the hospital.  Objective:  MRI Brain: IMPRESSION: No acute intracranial finding. Advanced chronic small-vessel ischemic changes throughout the brain as outlined above. Old small vessel infarctions of the right thalamus and right posterior basal ganglia/external capsule. Hemosiderin deposition associated with the old right thalamic infarction. No evidence of recent hemorrhage.  Vital signs in last 24 hours: Vitals:   06/21/19 0812 06/21/19 1702 06/21/19 1703 06/21/19 2303  BP: (!) 158/94 (!) 175/89 (!) 139/107 (!) 169/96  Pulse: 91 81 84 91  Resp: 20 19  20   Temp: 98.2 F (36.8 C) 97.9 F (36.6 C)  98.4 F (36.9 C)  TempSrc: Oral Oral    SpO2: 98% 96% 100% 100%  Weight:      Height:       Physical Exam General: Elderly appearing male sitting up in bed HEENT: NCAT CV: Normal S1-S2 no murmurs rubs or gallops appreciated PULM: Clear to auscultation bilaterally, no crackles or wheezes appreciated ABD: Soft and nontender in all quadrants NEURO: alert, but not oriented. Repeatedly asks where he is.  No focal deficits appreciated, no myoclonic jerks  Assessment/Plan:  Principal Problem:   Neurological movement disorder Active Problems:   Agitation   Essential hypertension   Tobacco abuse   Frequent falls   Closed traumatic minimally displaced fracture of one rib of left side   Aortic atherosclerosis (HCC)  In summary, Mr. 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  #Hx CVA #Multiple Falls #Myoclonic jerks:  Neurology has been following the patient since admission. Myoclonic jerking has improved since adding Klonopin 0.25 mg twice daily. Likely secondary to toxic metabolic encephalopathy that should improve over time. EEG did not demonstrate any evidence of seizures. MRI demonstrated chronic small vessel ischemic changes throughout the brain as well as old small vessel infarctions of the right thalamus and right posterior basal ganglia. CT chest, abdomen and pelvis was done on admission and did not show evidence of infection, but did demonstrate L 11th rib fx. - Neuro signed off, appreciate recs  -Continue Klonopin 0.25 mg disintegrating tablets BID for myoclonic jerks. Patient will need to follow-up in outpatient setting             -PT/OT/SLP consulted, will appreciate recommendations. Will need SNF placement. -Aspirin 81 mg daily  #L 11th rib fx: Caused by fall prior to this hospitalization.  Confirmed by CT of the chest on admission. Pt states pain is well controlled this AM. -Dilaudid 0.5 mg every 4 hours as needed   #Dementia #Aggitation: Improved. Pleasant today. Will continue to monitor -Attempt redirection PRN -Soft mittens -Ativan 1mg  PRN  #COPD #Tobacco abuse -Nicotine patch 21 mg  #HTN: BP elevated to  -Amlodipine 5 mg daily -Restart home Lisinopril 20 mg  #HLD -Lipitor 20 mg daily  #Hypothyroidism -Synthroid 100 mcg   #GERD -Protonix 40 mg   #FEN/GI -Diet: NPO, per SLP evaluation -Fluids: none   #DVT prophylaxis -SCDs  #CODE STATUS: FULL  #Dispo: Pending medical course. Pt will likely need  SNF placement upon d/c. Family wants the SNF to be in [place]. CSW consulted.   Kirt Boys, MD Internal Medicine, PGY1 Pager: [phone]  06/22/2019,10:50 AM

## 2019-06-22 NOTE — TOC Progression Note (Signed)
Transition of Care [facility]) - Progression Note    Patient Details  Name: Jordan Sawyer MRN: [id number] Date of Birth: 08-08-1945  [facility]) CM/SW Contact  Kermit Balo, RN Phone Number: 06/22/2019, 4:27 PM  Clinical Narrative:    CM spoke with patients wife and provided her the only bed offer: [facility]. They are not happy with this choice. They asked that he be faxed out in [place]. CM has increased the search area.  Also awaiting [facility]. All information requested sent to [place] must.  TOC following.   Expected Discharge Plan: Skilled Nursing Facility Barriers to Discharge: Continued Medical Work up  Expected Discharge Plan and Services Expected Discharge Plan: Skilled Nursing Facility In-house Referral: Clinical Social Work Discharge Planning Services: CM Consult Post Acute Care Choice: Skilled Nursing Facility Living arrangements for the past 2 months: Single Family Home                                       Social Determinants of Health (SDOH) Interventions    Readmission Risk Interventions Readmission Risk Prevention Plan 12/18/2018  Post Dischage Appt Not Complete  Appt Comments Office closed on 7/3  Medication Screening Complete  Transportation Screening Complete  Some recent data might be hidden

## 2019-06-22 NOTE — Care Management Important Message (Signed)
Important Message  Patient Details  Name: Jordan Sawyer MRN: [id number] Date of Birth: 1945-06-29   Medicare Important Message Given:  Yes     [name] [name] 06/22/2019, 3:17 PM

## 2019-06-22 NOTE — Progress Notes (Signed)
Occupational Therapy Evaluation Patient Details Name: Jordan Sawyer MRN: [id number] DOB: January 11, 1946 Today's Date: 06/22/2019    History of Present Illness 74 yo male s/p fall at home with 11th rib fx. EEG captured myoclonic jerks and started on klonopin. PMH: dementia, CVA June 2020 with L side residual deficits  COPD HTN GERD hypothyroidism frequent falls   Clinical Impression   PTA, pt was living at home with his wife, and reports he was independent with ADL/IADL and used a RW/cane intermittently. Pt currently requires minA for grooming and UB dressing sitting EOB, minA-modA for LB dressing and minA-modA for functional mobility at RW level. Due to decline in current level of function, pt would benefit from acute OT to address established goals to facilitate safe D/C to venue listed below. At this time, recommend SNF follow-up. Will continue to follow acutely.     Follow Up Recommendations  SNF;Supervision/Assistance - 24 hour    Equipment Recommendations  3 in 1 bedside commode    Recommendations for Other Services       Precautions / Restrictions Precautions Precautions: Fall Precaution Comments: history of frequent falls, posterior lean increases with fatigue Restrictions Weight Bearing Restrictions: No      Mobility Bed Mobility Overal bed mobility: Needs Assistance Bed Mobility: Supine to Sit;Sit to Supine     Supine to sit: Min guard Sit to supine: Min guard   General bed mobility comments: minguard for safety  Transfers Overall transfer level: Needs assistance Equipment used: Rolling walker (2 wheeled) Transfers: Sit to/from Stand Sit to Stand: Mod assist         General transfer comment: modA to powerup from low surface and for stability in standing;pt with minimal posterior lean this session;vc for safe hand placement    Balance Overall balance assessment: Needs assistance Sitting-balance support: Bilateral upper extremity supported;Feet  supported Sitting balance-Leahy Scale: Good Sitting balance - Comments: minA static sitting   Standing balance support: Bilateral upper extremity supported;During functional activity;Single extremity supported Standing balance-Leahy Scale: Poor Standing balance comment: modA with single UE during functional dynamic standing                           ADL either performed or assessed with clinical judgement   ADL Overall ADL's : Needs assistance/impaired Eating/Feeding: Set up;Sitting   Grooming: Min guard;Sitting;Minimal assistance   Upper Body Bathing: Min guard;Sitting;Minimal assistance   Lower Body Bathing: Minimal assistance;Sit to/from stand   Upper Body Dressing : Minimal assistance;Sitting   Lower Body Dressing: Minimal assistance;Moderate assistance;Sit to/from stand Lower Body Dressing Details (indicate cue type and reason): minA for stability while pt donned socks sitting in bed;modA for powerup into standing Toilet Transfer: Minimal assistance;Moderate assistance;RW Toilet Transfer Details (indicate cue type and reason): modA at times to side step and step backwards ToiletingTeacher, music and Hygiene: Moderate assistance;Sit to/from stand       Functional mobility during ADLs: Minimal assistance;Moderate assistance;Rolling walker General ADL Comments: minA for stabilty sitting EOB while completing ADL;modA for functional mobiltiy at RW level     Vision Baseline Vision/History: Wears glasses Wears Glasses: At all times Patient Visual Report: No change from baseline       Perception     Praxis      Pertinent Vitals/Pain Pain Assessment: 0-10 Pain Score: 4  Pain Location: generalized during movement Pain Descriptors / Indicators: Discomfort;Grimacing Pain Intervention(s): Limited activity within patient's tolerance;Monitored during session     Hand Dominance  Right   Extremity/Trunk Assessment Upper Extremity Assessment Upper  Extremity Assessment: Overall WFL for tasks assessed   Lower Extremity Assessment Lower Extremity Assessment: Defer to PT evaluation RLE Deficits / Details: trace myoclonic movements noted during mobility, pt with more control/coordination LLE Deficits / Details: same as RLE   Cervical / Trunk Assessment Cervical / Trunk Assessment: Normal   Communication Communication Communication: No difficulties   Cognition Arousal/Alertness: Awake/alert Behavior During Therapy: WFL for tasks assessed/performed Overall Cognitive Status: No family/caregiver present to determine baseline cognitive functioning                                 General Comments: pt appeared to have difficulty with short term memory, not formally assessed. Pt with poor awareness of deficits   General Comments  vss    Exercises     Shoulder Instructions      Home Living Family/patient expects to be discharged to:: Skilled nursing facility Living Arrangements: Spouse/significant other Available Help at Discharge: Family Type of Home: House Home Access: Level entry     Home Layout: One level     Bathroom Shower/Tub: Chief Strategy Officer: Standard     Home Equipment: Environmental consultant - 2 wheels;Cane - single point;Shower seat;Grab bars - toilet;Hand held shower head;Wheelchair - manual   Additional Comments: Wife can drive, but daughter and grandson do most errands/driving      Prior Functioning/Environment Level of Independence: Independent with assistive device(s)(pt reports he does most himself, wife helps some)        Comments: uses walker vs cane, history of falls        OT Problem List: Decreased activity tolerance;Impaired balance (sitting and/or standing);Decreased safety awareness;Decreased knowledge of use of DME or AE;Decreased knowledge of precautions;Pain;Decreased coordination      OT Treatment/Interventions: Self-care/ADL training;Therapeutic exercise;Energy  conservation;DME and/or AE instruction;Therapeutic activities;Patient/family education;Balance training    OT Goals(Current goals can be found in the care plan section) Acute Rehab OT Goals Patient Stated Goal: return home OT Goal Formulation: With patient Time For Goal Achievement: 07/06/19 Potential to Achieve Goals: Good ADL Goals Pt Will Perform Grooming: with modified independence;sitting;standing Pt Will Perform Upper Body Dressing: with modified independence;sitting;standing Pt Will Perform Lower Body Dressing: with modified independence;sit to/from stand Pt Will Transfer to Toilet: with modified independence;ambulating Pt Will Perform Toileting - Clothing Manipulation and hygiene: with modified independence;sit to/from stand  OT Frequency: Min 2X/week   Barriers to D/C: Decreased caregiver support  pt lives with wife       Co-evaluation              AM-PAC OT "6 Clicks" Daily Activity     Outcome Measure Help from another person eating meals?: A Little Help from another person taking care of personal grooming?: A Little Help from another person toileting, which includes using toliet, bedpan, or urinal?: A Lot Help from another person bathing (including washing, rinsing, drying)?: A Little Help from another person to put on and taking off regular upper body clothing?: A Little Help from another person to put on and taking off regular lower body clothing?: A Little 6 Click Score: 17   End of Session Equipment Utilized During Treatment: Gait belt;Rolling walker Nurse Communication: Mobility status  Activity Tolerance: Patient tolerated treatment well Patient left: in bed;with call bell/phone within reach;with bed alarm set  OT Visit Diagnosis: Unsteadiness on feet (R26.81);Other abnormalities of gait and mobility (  R26.89);Muscle weakness (generalized) (M62.81);History of falling (Z91.81);Pain Pain - part of body: (generalized)                Time: 1005-1027 OT Time  Calculation (min): 22 min Charges:  OT General Charges $OT Visit: 1 Visit OT Evaluation $OT Eval Moderate Complexity: [place] OTR/L Acute Rehabilitation Services Office: [place] 06/22/2019, 11:26 AM

## 2019-06-23 LAB — BASIC METABOLIC PANEL
Anion gap: 10 (ref 5–15)
BUN: 11 mg/dL (ref 8–23)
CO2: 22 mmol/L (ref 22–32)
Calcium: 8.7 mg/dL — ABNORMAL LOW (ref 8.9–10.3)
Chloride: 104 mmol/L (ref 98–111)
Creatinine, Ser: 1.03 mg/dL (ref 0.61–1.24)
GFR calc Af Amer: >60 mL/min (ref >60)
GFR calc non Af Amer: >60 mL/min (ref >60)
Glucose, Bld: 104 mg/dL — ABNORMAL HIGH (ref 70–99)
Potassium: 3.9 mmol/L (ref 3.5–5.1)
Sodium: 136 mmol/L (ref 135–145)

## 2019-06-23 LAB — CBC
HCT: 35.8 % — ABNORMAL LOW (ref 39.0–52.0)
Hemoglobin: 12.1 g/dL — ABNORMAL LOW (ref 13.0–17.0)
MCH: 29.9 pg (ref 26.0–34.0)
MCHC: 33.8 g/dL (ref 30.0–36.0)
MCV: 88.4 fL (ref 80.0–100.0)
Platelets: 266 10*3/uL (ref 150–400)
RBC: 4.05 MIL/uL — ABNORMAL LOW (ref 4.22–5.81)
RDW: 13 % (ref 11.5–15.5)
WBC: 6.1 10*3/uL (ref 4.0–10.5)
nRBC: 0 % (ref 0.0–0.2)

## 2019-06-23 LAB — SARS CORONAVIRUS 2 (TAT 6-24 HRS): SARS Coronavirus 2: NEGATIVE

## 2019-06-23 LAB — GLUCOSE, CAPILLARY
Glucose-Capillary: 86 mg/dL (ref 70–99)
Glucose-Capillary: 94 mg/dL (ref 70–99)

## 2019-06-23 MED ORDER — ACETAMINOPHEN 325 MG PO TABS
650.0000 mg | ORAL_TABLET | Freq: Four times a day (QID) | ORAL | Status: DC | PRN
  Administered 2019-06-27 – 2019-07-01 (×6): 650 mg via ORAL
  Filled 2019-06-23 (×7): qty 2

## 2019-06-23 MED ORDER — HALOPERIDOL LACTATE 5 MG/ML IJ SOLN
2.0000 mg | Freq: Once | INTRAMUSCULAR | Status: AC
  Administered 2019-06-23: 2 mg via INTRAVENOUS
  Filled 2019-06-23: qty 1

## 2019-06-23 MED ORDER — HALOPERIDOL LACTATE 5 MG/ML IJ SOLN
2.0000 mg | Freq: Once | INTRAMUSCULAR | Status: AC | PRN
  Administered 2019-06-23: 2 mg via INTRAVENOUS

## 2019-06-23 MED ORDER — QUETIAPINE FUMARATE 25 MG PO TABS: 25.0000 mg | ORAL_TABLET | Freq: Every day | ORAL | Status: DC

## 2019-06-23 MED ORDER — BENZONATATE 100 MG PO CAPS
100.0000 mg | ORAL_CAPSULE | Freq: Two times a day (BID) | ORAL | Status: DC
  Administered 2019-06-23 – 2019-07-01 (×18): 100 mg via ORAL
  Filled 2019-06-23 (×18): qty 1

## 2019-06-23 MED ORDER — QUETIAPINE FUMARATE 25 MG PO TABS
25.0000 mg | ORAL_TABLET | Freq: Every day | ORAL | Status: DC
  Administered 2019-06-23 – 2019-06-24 (×2): 25 mg via ORAL
  Filled 2019-06-23 (×2): qty 1

## 2019-06-23 MED ORDER — CLONAZEPAM 0.125 MG PO TBDP
0.2500 mg | ORAL_TABLET | Freq: Two times a day (BID) | ORAL | Status: DC
  Administered 2019-06-23 – 2019-07-01 (×18): 0.25 mg via ORAL
  Filled 2019-06-23 (×4): qty 1
  Filled 2019-06-23 (×2): qty 2
  Filled 2019-06-23: qty 1
  Filled 2019-06-23: qty 2
  Filled 2019-06-23 (×2): qty 1
  Filled 2019-06-23 (×2): qty 2
  Filled 2019-06-23 (×2): qty 1
  Filled 2019-06-23: qty 2
  Filled 2019-06-23 (×2): qty 1
  Filled 2019-06-23: qty 2

## 2019-06-23 NOTE — Progress Notes (Signed)
Paged about patient agitation, yelling, trying to get out of bed. Patient given Haldol 2 mg x2 overnight without sustained improvement. Evaluated patient at bedside. Patient attempts to leave bed and frequently gets irritated, yelling at staff when addressed. Patient is briefly redirectable but unable to be safely left alone as he repeatedly tries to leave his bed. Sitter is ordered but unable to be provided due to staffing issues.  Plan: * Soft restraints ordered * 1 to 1 sitter if available - If patient has sitter, will likely not require restraints * Klonopin given   Katherine Roan, MD 06/23/2019, 5:48 AM

## 2019-06-23 NOTE — Progress Notes (Signed)
  Speech Language Pathology Treatment: Dysphagia  Patient Details Name: Jordan Sawyer MRN: [id number] DOB: 27-Jun-1945 Today's Date: 06/23/2019 Time: 1448-1856 SLP Time Calculation (min) (ACUTE ONLY): 19 min  Assessment / Plan / Recommendation Clinical Impression  Pt sleepy at time of session but adequately alert to observe with po's. Incomplete mastication of solid, cued to masticate and pt swallowed with immediate coughing that continued for one minute. SLP suspects possible airway intrusion (with sensation). Larger sips thin via straw resulted in coughing as well mitigated by smaller straw sips. Downgrading diet to Dys 1 (puree), continue thin. Plans to discharge to nursing home. Will sign off and recommend upgrade text if appropriate at SNF.   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 Per chart review he was admitted to the hospital from December 15, 2018 to January 14, 2019 for an acute lacunar stroke involving the right brain.  He received TPA and unfortunately had a small right 67 mm thalamic post TPA hemorrhagic conversion without cytotoxic edema or intraventricular extension.  Pt completed a MBS on 12/17/18 which reported functional oropharyngeal dysphagia with suspected primary esophageal dysphagia.        SLP Plan  Continue with current plan of care       Recommendations  Diet recommendations: Dysphagia 1 (puree);Thin liquid Liquids provided via: Straw;Cup Medication Administration: Crushed with puree Supervision: Staff to assist with self feeding;Full supervision/cueing for compensatory strategies Compensations: Small sips/bites;Slow rate;Minimize environmental distractions Postural Changes and/or Swallow Maneuvers: Seated upright 90 degrees                Oral Care Recommendations: Oral care  BID Follow up Recommendations: Skilled Nursing facility SLP Visit Diagnosis: Dysphagia, unspecified (R13.10) Plan: Continue with current plan of care       GO                Jordan Sawyer 06/23/2019, 1:50 PM

## 2019-06-23 NOTE — Progress Notes (Signed)
RN requesting restraints 

## 2019-06-23 NOTE — Progress Notes (Signed)
Pt c/o congestion. MD paged.

## 2019-06-23 NOTE — Progress Notes (Addendum)
Subjective:   Overnight the patient had more agitation and required Haldol 2 mg (x2) and was put in four-point restraints.  He reportedly attempted to get out of bed whenever he was left alone and was yelling at the staff.  Pt seen at the bedside this AM. Pt has is restrained but calm and appears tired. He states that he would like restraints taken off.  When asked if he would try to get up if we remove the restraints, the patient said yes.  He is amenable to keeping them on for now.  No other complaints at this time.  Objective:  MRI Brain: IMPRESSION: No acute intracranial finding. Advanced chronic small-vessel ischemic changes throughout the brain as outlined above. Old small vessel infarctions of the right thalamus and right posterior basal ganglia/external capsule. Hemosiderin deposition associated with the old right thalamic infarction. No evidence of recent hemorrhage.  Vital signs in last 24 hours: Vitals:   06/21/19 1703 06/21/19 2303 06/22/19 0910 06/22/19 2316  BP: (!) 139/107 (!) 169/96 (!) 176/96 (!) 159/98  Pulse: 84 91 87 (!) 104  Resp:  20 17 20   Temp:  98.4 F (36.9 C) 97.6 F (36.4 C) 98.2 F (36.8 C)  TempSrc:   Oral   SpO2: 100% 100% 98% 98%  Weight:      Height:       Physical Exam General: Tired, elderly appearing male restrained in bed HEENT: NCAT, EOMI CV: RRR, normal S1-S2 no murmurs rubs or gallops appreciated PULM: clear in all lung fields ABD: Soft and nontender in all quadrants NEURO: alert, but not oriented. Repeatedly asks where he is.  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  #Hx CVA #Multiple Falls #Myoclonic jerks:  Neurology has been following the patient since admission. Myoclonic jerking has improved since adding Klonopin 0.25 mg twice daily. Likely secondary to toxic metabolic encephalopathy that should improve over time. EEG did not demonstrate any evidence of seizures. MRI demonstrated chronic small vessel ischemic changes throughout the brain as well as old small vessel infarctions of the right thalamus and right posterior basal ganglia. CT chest, abdomen and pelvis was done on admission and did not show evidence of infection, but did demonstrate L 11th rib fx. -Neuro signed off, appreciate recs  -Continue Klonopin 0.25 mg disintegrating tablets BID for myoclonic jerks. Patient will need to follow-up in outpatient setting             -PT/OT/SLP consulted, will appreciate recommendations. Will need SNF placement. -Aspirin 81 mg daily  #L 11th rib fx: Caused by fall prior to this hospitalization.  Confirmed by CT of the chest on admission. Pt states pain is well controlled this AM. -Oxycodone 5 mg every 4 hours as needed -Tylenol 650 mg every 6 hours as needed   #Dementia #Aggitation: Overnight the patient had more agitation and required Haldol 2 mg (x2) and was put in four-point restraints. He reportedly attempted  to get out of bed whenever he was left alone and was yelling at the staff. -Attempt redirection PRN -Restraints -Will reassess if agitated. Will avoid benzos, as this may paradoxically increase aggitation  #COPD #Tobacco abuse -Nicotine patch 21 mg  #HTN: BP 150/102 today, and ranged from 160-176 systolic overnight. -Amlodipine 5 mg daily -Lisinopril 20 mg daily   #HLD -Lipitor 20  mg daily  #Hypothyroidism -Synthroid 100 mcg   #GERD -Protonix 40 mg   #FEN/GI -Diet: NPO, per SLP evaluation -Fluids: none   #DVT prophylaxis -SCDs  #CODE STATUS: FULL  #Dispo: Pending medical course. Pt will likely need SNF placement upon d/c. Family wants the SNF to be in [place]. CSW consulted and I will continue to follow-up.  Jordan Boys, MD Internal Medicine, PGY1 Pager: [phone]  06/23/2019,10:42 AM

## 2019-06-23 NOTE — Progress Notes (Signed)
Spoke with pt daughter [facility] after speaking with round team this am and updated on pt status and safety interventions that was reported by night shift RN. Daughter states that she would prefer pt to have prn ativan on board. Pt daughter states she will try to come around 1000 to be with pt.

## 2019-06-23 NOTE — Progress Notes (Signed)
Physical Therapy Treatment Patient Details Name: Jordan Sawyer MRN: [id number] DOB: 12-26-1945 Today's Date: 06/23/2019    History of Present Illness 74 yo male s/p fall at home with 11th rib fx. EEG captured myoclonic jerks and started on klonopin. 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  Difficulty with positioning within RW which impacts balance. Con't to recommend SNF.   Follow Up Recommendations  SNF     Equipment Recommendations  None recommended by PT    Recommendations for Other Services       Precautions / Restrictions Precautions Precautions: Fall Precaution Comments: history of frequent falls, posterior lean increases with fatigue    Mobility  Bed Mobility Overal bed mobility: Needs Assistance Bed Mobility: Supine to Sit;Sit to Supine     Supine to sit: Min assist Sit to supine: Min assist   General bed mobility comments: MIN A for trunk on supine>sit and MIN for legs on sit > supine  Transfers Overall transfer level: Needs assistance Equipment used: Rolling walker (2 wheeled) Transfers: Sit to/from Stand Sit to Stand: Mod assist         General transfer comment: MOD A for power tand to get balance. tendency to go posterior  Ambulation/Gait Ambulation/Gait assistance: Min assist;Mod assist Gait Distance (Feet): 20 Feet Assistive device: Rolling walker (2 wheeled) Gait Pattern/deviations: Narrow base of support;Decreased stride length;Step-to pattern;Leaning posteriorly Gait velocity: decr   General Gait Details: Pt with short step lengths and needing cues for position within RW. MIN A with straight gait and MOD A for turns. Pt too fatigued to attempt more- possibly due to meds??   Marine scientist [place] (Stroke Patients Only)       Balance Overall balance assessment:  Needs assistance Sitting-balance support: Bilateral upper extremity supported;Feet supported Sitting balance-Leahy Scale: Good     Standing balance support: Bilateral upper extremity supported;During functional activity;Single extremity supported Standing balance-Leahy Scale: Poor Standing balance comment: requires UE support and cues for safety                            Cognition Arousal/Alertness: Lethargic;Suspect due to medications Behavior During Therapy: Flat affect Overall Cognitive Status: No family/caregiver present to determine baseline cognitive functioning                                        Exercises      General Comments        Pertinent Vitals/Pain Pain Assessment: Faces Faces Pain Scale: No hurt Pain Intervention(s): Monitored during session    Home Living                      Prior Function            PT Goals (current goals can now be found in the care plan section) Acute Rehab PT Goals Potential to Achieve Goals: Fair Progress towards PT goals: Progressing toward goals    Frequency    Min 3X/week      PT Plan Current plan remains appropriate    Co-evaluation  AM-PAC PT "6 Clicks" Mobility   Outcome Measure  Help needed turning from your back to your side while in a flat bed without using bedrails?: None Help needed moving from lying on your back to sitting on the side of a flat bed without using bedrails?: A Little Help needed moving to and from a bed to a chair (including a wheelchair)?: A Little Help needed standing up from a chair using your arms (e.g., wheelchair or bedside chair)?: A Little Help needed to walk in hospital room?: A Lot Help needed climbing 3-5 steps with a railing? : A Lot 6 Click Score: 17    End of Session Equipment Utilized During Treatment: Gait belt Activity Tolerance: Patient limited by fatigue;Patient limited by lethargy Patient left: in bed;with  call bell/phone within reach;with bed alarm set;with restraints reapplied Nurse Communication: Mobility status PT Visit Diagnosis: Unsteadiness on feet (R26.81);Repeated falls (R29.6);History of falling (Z91.81);Hemiplegia and hemiparesis Hemiplegia - Right/Left: Right Hemiplegia - dominant/non-dominant: Non-dominant Hemiplegia - caused by: Cerebral infarction     Time: 1230-1259 PT Time Calculation (min) (ACUTE ONLY): 29 min  Charges:  $Gait Training: 8-22 mins $Therapeutic Activity: 8-22 mins                     [name] L. Katrinka Blazing, [place] Pager [phone] 06/23/2019    [name] 06/23/2019, 2:54 PM

## 2019-06-24 DIAGNOSIS — G4700 Insomnia, unspecified: Secondary | ICD-10-CM

## 2019-06-24 LAB — CULTURE, BLOOD (ROUTINE X 2)
Culture: NO GROWTH
Culture: NO GROWTH
Special Requests: ADEQUATE

## 2019-06-24 LAB — GLUCOSE, CAPILLARY: Glucose-Capillary: 92 mg/dL (ref 70–99)

## 2019-06-24 MED ORDER — RAMELTEON 8 MG PO TABS
8.0000 mg | ORAL_TABLET | Freq: Every day | ORAL | Status: DC
  Administered 2019-06-24 – 2019-07-01 (×8): 8 mg via ORAL
  Filled 2019-06-24 (×9): qty 1

## 2019-06-24 MED ORDER — KETOROLAC TROMETHAMINE 30 MG/ML IJ SOLN
30.0000 mg | Freq: Four times a day (QID) | INTRAMUSCULAR | Status: AC | PRN
  Administered 2019-06-24 – 2019-06-29 (×4): 30 mg via INTRAVENOUS
  Filled 2019-06-24 (×4): qty 1

## 2019-06-24 MED ORDER — RAMELTEON 8 MG PO TABS
8.0000 mg | ORAL_TABLET | Freq: Every day | ORAL | Status: DC
  Filled 2019-06-24: qty 1

## 2019-06-24 NOTE — Consult Note (Signed)
WOC Nurse Consult Note: Patient receiving care in [facility] 2W22.  Primay RN in room and explained they needed recommendations for the skin tears and abrasions from his pre-hospital fall. Reason for Consult: "skin mgmt prior to d/c" Wound type: scattered skin tears, abrasions over body Pressure Injury POA: Yes/No/NA Measurement: The left hip has a skin tear without any skin flap that measures 1 cm x 5 cm Wound bed: 100% pink Drainage (amount, consistency, odor) area open to air, no drainage. Periwound: The is significant dark purple bruising covering the vast majority of the left rib cage and upper back.  Expect this to continue to evolve and depending on the severity of skin necrosis, may need surgical intervention.  This area should be closely monitored for eschar formation. Dressing procedure/placement/frequency: Hydrocolloid to left hip skin tear, change daily. Foam dressing for all other skin tears and abrasions.  Monitor the wound area(s) for worsening of condition such as: Signs/symptoms of infection,  Increase in size,  Development of or worsening of odor, Development of pain, or increased pain at the affected locations.  Notify the medical team if any of these develop.  Thank you for the consult.  Discussed plan of care with the patient and bedside nurse.  WOC nurse will not follow at this time.  Please re-consult the WOC team if needed.  Helmut Muster, RN, MSN, CWOCN, CNS-BC, pager [phone]

## 2019-06-24 NOTE — Progress Notes (Signed)
Updated daughter Mimi via phone about pt status; ate dinner, calm, had lucid conversation about his fall, playing football, his church, his family. Pt disoriented to time, and vaguely situation & place.

## 2019-06-24 NOTE — Progress Notes (Signed)
0125: IMTS paged re: extreme agitation. Pt working so hard to get out of bed, out of breath, panting, attempting to leave with all his strength. Pt not redirectable with telesitter - call from telesitter x3 in the past ~1 hour.

## 2019-06-24 NOTE — Progress Notes (Addendum)
I returned to the bedside to evaluate the patient again today after he was somnolent this morning.  Patient is resting comfortably in bed, but still somnolent.  He will wake up to the call of his name but quickly falls back asleep.  I asked him how he is feeling, he says he is felt better.  Still complains of pain at the left side of his chest at the rib fracture site.  On exam, patient's right eye is droopy compared to the left but does not complain of any sensory differences on his face, hands, or legs.  Neurologic exam was unremarkable and reassuring.  I think his somnolence is likely secondary to centrally acting medications, including oxycodone 5 mg every 4 as needed, Seroquel 25 mg nightly, and Klonopin 0.25 mg twice daily.  I will D/c the oxycodone and replace it with IV Toradol 30 mg every 6 hours as needed.  Kirt Boys, MD Internal Medicine, PGY1 Pager: [phone]  06/24/2019,4:09 PM

## 2019-06-24 NOTE — Progress Notes (Addendum)
Subjective:   Overnight the patient had more agitation. Nurse states pt was attempting to get up out of bed with all of his strength and difficult to redirect. Pt received a PRN oxycodone for his rib fx pain and eventually went to sleep thereafter.   Pt seen at the bedside this AM. Pt appears tired. Falls back asleep when attempting to ask how he is doing. Pt says he has some back pain but no other complaints at this time.   Objective:  MRI Brain: IMPRESSION: No acute intracranial finding. Advanced chronic small-vessel ischemic changes throughout the brain as outlined above. Old small vessel infarctions of the right thalamus and right posterior basal ganglia/external capsule. Hemosiderin deposition associated with the old right thalamic infarction. No evidence of recent hemorrhage.  Vital signs in last 24 hours: Vitals:   06/23/19 0735 06/23/19 1717 06/23/19 2233 06/23/19 2341  BP: (!) 150/102 (!) 168/103 (!) 164/88 (!) 143/84  Pulse: 97 98 88 69  Resp: 20 19 20    Temp: 97.8 F (36.6 C) 97.8 F (36.6 C) 97.9 F (36.6 C) 98.3 F (36.8 C)  TempSrc: Oral Oral Axillary Axillary  SpO2: 97% 99% 97% 98%  Weight:      Height:       Physical Exam General: Tired, elderly appearing male restrained in bed HEENT: NCAT, EOMI CV: RRR, normal S1-S2 no murmurs rubs or gallops appreciated PULM: clear in all lung fields ABD: Soft and nontender in all quadrants NEURO: alert, but not oriented. Repeatedly asks where he is.  No focal deficits appreciated, mild myoclonic jerks present  Assessment/Plan:  Principal Problem:   Neurological movement disorder Active Problems:   Agitation   Essential hypertension   Tobacco abuse   Frequent falls   Closed traumatic minimally displaced fracture of one rib of left side   Aortic atherosclerosis (HCC)  In summary, Mr. Dusza is a 74 year old male with past medical history significant for dementia, CVA in June 2020, COPD, HTN, GERD,  hypothyroidism, tobacco use disorder presents today after sustaining multiple falls over the last several months, more frequently this past week prior to admission, upper extremity myoclonic jerking. The patient is medically stable and ready for discharge.   #Hx CVA #Multiple Falls #Myoclonic jerks: Myoclonic jerking has improved since adding Klonopin 0.25 mg twice daily. EEG did not demonstrate any evidence of seizures. MRI demonstrated chronic small vessel ischemic changes throughout the brain as well as old small vessel infarctions of the right thalamus and right posterior basal ganglia.  -Neuro signed off, appreciate recs  -Continue Klonopin 0.25 mg disintegrating tablets BID for myoclonic jerks. Patient will need to follow-up in outpatient setting             -PT/OT/SLP consulted, will appreciate recommendations. Will need SNF placement. -Aspirin 81 mg daily  #Insomnia #Dementia #Aggitation: Overnight the patient had more agitation and was difficult to redirect. No restraints or sedating medications had to be used. -Seroquel 25 mg nightly  -Start Ramelteon 8mg  nightly  -Attempt redirection PRN -No restraints, sedating medications or tele sitter for 24 hrs prior to d/c to SNF  #L 11th rib fx: Caused by fall prior to this hospitalization. Confirmed by CT of the chest on admission. Pt states pain is well controlled this AM. -Oxycodone 5 mg every 4 hours as needed -Tylenol 650 mg every 6 hours as needed  #COPD #Tobacco abuse -Nicotine patch 21 mg  #HTN: BP 150/102 today, and ranged from 811-572 systolic overnight. -Amlodipine 5 mg daily -Lisinopril  20 mg daily   #HLD -Lipitor 20 mg daily  #Hypothyroidism -Synthroid 100 mcg   #GERD -Protonix 40 mg   #FEN/GI -Diet: NPO, per SLP evaluation -Fluids: none  #DVT prophylaxis -SCDs  #CODE STATUS: FULL  #Dispo: Medically ready for discharge. Pt needs SNF placement upon d/c. Family wants the SNF to be in [place]. CSW working  on placement.  Kirt Boys, MD Internal Medicine, PGY1 Pager: [phone]  06/24/2019,1:54 PM

## 2019-06-25 DIAGNOSIS — G253 Myoclonus: Secondary | ICD-10-CM

## 2019-06-25 LAB — CBC
HCT: 37 % — ABNORMAL LOW (ref 39.0–52.0)
Hemoglobin: 12.4 g/dL — ABNORMAL LOW (ref 13.0–17.0)
MCH: 30 pg (ref 26.0–34.0)
MCHC: 33.5 g/dL (ref 30.0–36.0)
MCV: 89.4 fL (ref 80.0–100.0)
Platelets: 270 10*3/uL (ref 150–400)
RBC: 4.14 MIL/uL — ABNORMAL LOW (ref 4.22–5.81)
RDW: 13.2 % (ref 11.5–15.5)
WBC: 6.1 10*3/uL (ref 4.0–10.5)
nRBC: 0 % (ref 0.0–0.2)

## 2019-06-25 LAB — BASIC METABOLIC PANEL
Anion gap: 10 (ref 5–15)
BUN: 20 mg/dL (ref 8–23)
CO2: 23 mmol/L (ref 22–32)
Calcium: 8.6 mg/dL — ABNORMAL LOW (ref 8.9–10.3)
Chloride: 105 mmol/L (ref 98–111)
Creatinine, Ser: 1.13 mg/dL (ref 0.61–1.24)
GFR calc Af Amer: >60 mL/min (ref >60)
GFR calc non Af Amer: >60 mL/min (ref >60)
Glucose, Bld: 100 mg/dL — ABNORMAL HIGH (ref 70–99)
Potassium: 3.8 mmol/L (ref 3.5–5.1)
Sodium: 138 mmol/L (ref 135–145)

## 2019-06-25 MED ORDER — RAMELTEON 8 MG PO TABS: 8.0000 mg | ORAL_TABLET | Freq: Every day | ORAL | Status: DC

## 2019-06-25 MED ORDER — QUETIAPINE FUMARATE 25 MG PO TABS
12.5000 mg | ORAL_TABLET | Freq: Every day | ORAL | Status: DC
  Administered 2019-06-25 – 2019-06-27 (×3): 12.5 mg via ORAL
  Filled 2019-06-25 (×3): qty 1

## 2019-06-25 MED ORDER — CLONAZEPAM 0.25 MG PO TBDP: 0.2500 mg | ORAL_TABLET | Freq: Two times a day (BID) | ORAL | 0 refills | Status: DC

## 2019-06-25 MED ORDER — QUETIAPINE FUMARATE 25 MG PO TABS: 25.0000 mg | ORAL_TABLET | Freq: Every day | ORAL | Status: DC

## 2019-06-25 NOTE — Discharge Summary (Addendum)
Name: Jordan Sawyer MRN: [id number] DOB: 31-May-1946 74 y.o. PCP: Jordan Conradi, NP  Date of Admission: 06/19/2019  7:17 AM Date of Discharge:  07/06/2018 Attending Physician: Jordan Rung, DO  Discharge Diagnosis: 1.  Recurrent falls 2. Upper extremity myoclonic jerks 3. Dementia w/ aggitation 4. L 11th rib fx 5.  Klebsiella pneumoniae UTI  Discharge Medications: Allergies as of 07/07/2019   No Known Allergies     Medication List    STOP taking these medications   clindamycin 150 MG capsule Commonly known as: CLEOCIN   HYDROcodone-acetaminophen 10-325 MG tablet Commonly known as: NORCO     TAKE these medications   aspirin EC 81 MG tablet Take 1 tablet (81 mg total) by mouth daily.   atorvastatin 20 MG tablet Commonly known as: Lipitor Take 1 tablet (20 mg total) by mouth daily.   cephALEXin 500 MG capsule Commonly known as: KEFLEX Take 1 capsule (500 mg total) by mouth every 12 (twelve) hours for 2 days. Start taking on: July 08, 2019   cyanocobalamin 1000 MCG/ML injection Commonly known as: (VITAMIN B-12) Inject 1,000 mcg into the muscle every 30 (thirty) days.   lamoTRIgine 200 MG tablet Commonly known as: LAMICTAL Take 200 mg by mouth daily.   levothyroxine 100 MCG tablet Commonly known as: SYNTHROID Take 100 mcg by mouth daily before breakfast.   lisinopril 20 MG tablet Commonly known as: ZESTRIL Take 1 tablet (20 mg total) by mouth 2 (two) times a day.   nicotine 21 mg/24hr patch Commonly known as: NICODERM CQ - dosed in mg/24 hours Place 1 patch (21 mg total) onto the skin daily.   OLANZapine 5 MG tablet Commonly known as: ZYPREXA Take 1 tablet (5 mg total) by mouth daily. Start taking on: July 08, 2019   oxybutynin 5 MG tablet Commonly known as: DITROPAN Take 5 mg by mouth 2 (two) times daily.   pantoprazole 40 MG tablet Commonly known as: PROTONIX Take 40 mg by mouth daily.   tamsulosin 0.4 MG Caps capsule Commonly  known as: FLOMAX Take 0.4 mg by mouth daily.   venlafaxine XR 150 MG 24 hr capsule Commonly known as: EFFEXOR-XR Take 150 mg by mouth daily with breakfast.            Durable Medical Equipment  (From admission, onward)         Start     Ordered   07/07/19 1045  For home use only DME Hospital bed  Once    Question Answer Comment  Length of Need Lifetime   Bed type Semi-electric      07/07/19 1044   07/07/19 1043  For home use only DME lightweight manual wheelchair with seat cushion  Once    Comments: Patient suffers from dementia which impairs their ability to perform daily activities like ambulating in the home.  A walking aid will not resolve  His issue with performing activities of daily living. A wheelchair will allow patient to safely perform daily activities. Patient is not able to propel themselves in the home using a standard weight wheelchair due to balance issues. Patient can self propel in the lightweight wheelchair. Accessories: elevating leg rests (ELRs), wheel locks, extensions and anti-tippers.   07/07/19 1044   07/07/19 0959  DME 3-in-1  Once     07/07/19 1000   06/25/19 0811  DME 3-in-1  Once     06/25/19 0813         Disposition and follow-up:  JordanJordan Sawyer was discharged from [facility] in Stable condition.  At the hospital follow up visit please address:  1.  Recurrent falls: Pt has had increased frequency in falling. Reportedly falling multiple times a week. Pt's caretakers are his daughter and wife, and after most recent fall stated they are not equipped to take care of him and feel overwhelmed. Pt was discharged home with home health PT.  2. Upper extremity myoclonic jerks: Pt initially came in with bialteral upper extremity myoclonic jerks. CT head negative for acute process. MRI head showed chronic ischemic changes, but nothing acute.  Neurology started Klonopin 0.25 mg twice daily and the patient responded well. Pt no longer  taking it and has not had recurrence of jerking.  -Follow-up with neurology in outpatient setting  3. Dementia w/ aggitation: Patient was intermittently agitated throughout the hospitalization  3. L 11th rib fx: Injury from previous fall prior to this hospitalization. Patient received oxycodone 5 mg every 4 hours as needed. This was discontinued several days before discharge due to somnolence. -Reevaluate patient's need for opiates to manage pain  4.  Klebsiella pneumoniae UTI: Finish 10-day course of Keflex 500 mg twice daily. Stop date 1/22.  2.  Labs / imaging needed at time of follow-up: UA if urinary symptoms present  3.  Pending labs/ test needing follow-up: none  Follow-up Appointments:   Patient will need to schedule follow-up appointment with neurology to address long-term treatment for upper extremity myoclonic jerks.  Hospital Course by problem list: 1.  Recurrent falls: In summary,Jordan Sawyer is a 74 year old male with past medical history significant for dementia, CVA in June 2020,COPD, HTN, GERD,hypothyroidism, tobacco use disorderpresents today after sustaining multiple fallsover the last several months, morefrequently this past week prior to admission and upper extremity myoclonic jerking. CT of the head did not show any acute process. MRI showed chronic small vessel ischemic changes.  PT/OT recommend SNF  2. Upper extremity myoclonic jerks: Pt presented with bilateral upper extremity jerking. Head imaging did not revel an acute cause. EEG was performed while jerking was present and did not see seizure activity.  Neurology was consulted and recommended Klonopin 0.25 mg BID. The patient responded well to this medication and Neurology signed off.  -Follow up with Neurology in the outpatient setting.  3. Dementia w/ aggitation: The patient was aggressive on admission. Had to give Ativan and Haldol to keep him from trying to get out of bed. For several nights, the patient  required Haldol and physical restraints due to agitation, particularly during night time hours. Pt was put on Seroquel 25 mg nightly for mood stabilization and Ramelteon 8 mg for sleep. The night prior to discharge the patient did not need sedating medications, restraints or sitter.  3. L 11th rib fx: Pt had L chest wall pain from rib fx that occurred from a previous fall. This was treated with oxycodone 5 mg q4hrs PRN. Later on in the hospitalization, the patient became more somnolent and we discontinued this medication and replaced it with Toradol shots as needed in an effort to increase alertness.  Neurologic exam was reassuring.  4.  Klebsiella pneumoniae UTI: During his hospitalization, he was found to have Klebsiella pneumonia UTI which was sensitive to cephalosporins.  He was initially treated with IV Vancomycin and cefepime for several days which was transitioned to oral Keflex.  Stop date 1/22.  Discharge Vitals:   BP 111/81 (BP Location: Right Arm)   Pulse (!) 110  Temp 98.6 F (37 C) (Oral)   Resp 16   Ht 5\' 10"  (1.778 m)   Wt 78 kg   SpO2 97%   BMI 24.67 kg/m   Pertinent Labs, Studies, and Procedures:  CBC Latest Ref Rng & Units 07/04/2019 07/03/2019 07/02/2019  WBC 4.0 - 10.5 K/uL 4.3 6.4 11.0(H)  Hemoglobin 13.0 - 17.0 g/dL 12.2(L) 11.7(L) 13.0  Hematocrit 39.0 - 52.0 % 36.5(L) 36.0(L) 38.9(L)  Platelets 150 - 400 K/uL 241 228 245   BMP Latest Ref Rng & Units 07/04/2019 07/03/2019 07/02/2019  Glucose 70 - 99 mg/dL 96 07/04/2019) 008(Q)  BUN 8 - 23 mg/dL 14 22 23   Creatinine 0.61 - 1.24 mg/dL 761(P 5.09  Sodium 135 - 145 mmol/L 140 137 138  Potassium 3.5 - 5.1 mmol/L 3.8 3.8 4.0  Chloride 98 - 111 mmol/L 106 107 106  CO2 22 - 32 mmol/L 25 22 20(L)  Calcium 8.9 - 10.3 mg/dL 9.2 3.26) 7.12)   MRI Brain: IMPRESSION: No acute intracranial finding. Advanced chronic small-vessel ischemic changes throughout the brain as outlined above. Old small vessel infarctions of the  right thalamus and right posterior basal ganglia/external capsule. Hemosiderin deposition associated with the old right thalamic infarction. No evidence of recent hemorrhage.  CT Head: IMPRESSION: 1. Significantly motion degraded, limited examination. 2. No acute intracranial abnormality is identified. 3. Stable generalized parenchymal atrophy and chronic small vessel ischemic disease. Redemonstrated chronic infarct within the posterior right basal ganglia/external capsule.  CT Cervical Spine: IMPRESSION: No fracture or static subluxation of the cervical spine.  CT Chest: IMPRESSION: 1. Mildly displaced left eleventh rib fracture. 2. Aortic atherosclerosis. 3. Nonobstructive left renal calculus. No hydronephrosis or renal obstruction is noted. 4. No other significant abnormality seen in the chest, abdomen or Pelvis.  CT Abd & Pelvis: IMPRESSION: 1. Mildly displaced left eleventh rib fracture. 2. Aortic atherosclerosis. 3. Nonobstructive left renal calculus. No hydronephrosis or renal obstruction is noted. 4. No other significant abnormality seen in the chest, abdomen or Pelvis.  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 No fracture or dislocation is noted.  There is no evidence of fracture or other focal bone lesions. Soft tissues are unremarkable.  Discharge Instructions: Discharge Instructions    Call MD for:  difficulty breathing, headache or visual disturbances   Complete by: As directed    Call MD for:  extreme fatigue   Complete by: As directed    Call MD for:  hives   Complete by: As directed    Call MD for:  persistant dizziness or light-headedness   Complete by: As directed    Call MD for:  persistant nausea and vomiting   Complete by: As directed    Call MD for:  redness, tenderness, or signs of infection (pain, swelling, redness, odor or green/yellow discharge around  incision site)   Complete by: As directed    Call MD for:  severe uncontrolled pain   Complete by: As directed    Call MD for:  temperature >100.4   Complete by: As directed    Diet - low sodium heart healthy   Complete by: As directed    Diet - low sodium heart healthy   Complete by: As directed    Diet - low sodium heart healthy   Complete by: As directed    Discharge instructions   Complete by: As directed    Thank you for allowing [facility] to  take care of you during your hospitalization. Below is a summary of what we treated:   1. Falls  - You were evaluated by our Physical and Occupational Therapists, and they recommend you get rehab at a [facility] to help prevent you from falling again.   2. Arm jerking  -You had small jerking of your arms when you first came in. The Neurologist would like to follow up with you once you are discharged from the hospital.  -Take Klonopin 0.25mg  twice a day to treat the arm jerking.   If you have any questions or concerns, please feel free to reach out to [place].   Discharge instructions   Complete by: As directed    Thank you for allowing [place] to take care of you during your hospitalization. Below is a summary of what we treated:  1. Falls - You were evaluated by our Physical and Occupational Therapists, and they recommend you get rehab at a [facility] to help prevent you from falling again.  2. Arm jerking -You had small jerking of your arms when you first came in. The Neurologist would like to follow up with you once you are discharged from the hospital. -Take Klonopin 0.25mg  twice a day to treat the arm jerking.  If you have any questions or concerns, please feel free to reach out to [place].   Increase activity slowly   Complete by: As directed    Increase activity slowly   Complete by: As directed    Increase activity slowly   Complete by: As directed    Increase activity slowly   Complete by: As directed       Signed: Earlene Plater, MD Internal Medicine, PGY1 Pager: [phone]  07/07/2019,12:07 PM

## 2019-06-25 NOTE — Progress Notes (Signed)
Physical Therapy Treatment Patient Details Name: Jordan Sawyer MRN: [id number] DOB: 1946/03/08 Today's Date: 06/25/2019    History of Present Illness Pt is a 74 y.o. male admitted 06/19/19 after fall at home; sustained 11th rib fx. EEG captured myoclonic jerks and started on klonopin. 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   PT Comments    Pt progressing well with mobility. Pleasant and motivated to participate with PT. Able to progress transfer and gait training with RW, pt requiring frequent min-modA to prevent LOB with dynamic standing tasks. Remains limited by generalized weakness, decreased activity tolerance and poor balance strategies/postural reactions. Continue to recommend SNF-level therapies to maximize functional mobility and independence prior to return home.   Follow Up Recommendations  SNF;Supervision for mobility/OOB     Equipment Recommendations  None recommended by PT    Recommendations for Other Services       Precautions / Restrictions Precautions Precautions: Fall Precaution Comments: history of frequent falls, posterior lean increases with fatigue Restrictions Weight Bearing Restrictions: No    Mobility  Bed Mobility Overal bed mobility: Needs Assistance Bed Mobility: Supine to Sit;Sit to Supine     Supine to sit: Supervision;HOB elevated Sit to supine: Min assist   General bed mobility comments: MinA for LEs return to supine; use of rail  Transfers Overall transfer level: Needs assistance Equipment used: Rolling walker (2 wheeled) Transfers: Sit to/from Stand Sit to Stand: Min assist         General transfer comment: Cues for hand placement, minA to stabilize upon standing and correct posterior lean  Ambulation/Gait Ambulation/Gait assistance: Min assist;Mod  assist Gait Distance (Feet): 80 Feet Assistive device: Rolling walker (2 wheeled) Gait Pattern/deviations: Narrow base of support;Decreased stride length;Leaning posteriorly;Step-through pattern Gait velocity: Decreased Gait velocity interpretation: <1.31 ft/sec, indicative of household ambulator General Gait Details: Slow, unsteady gait with RW and frequent minA to maintain balance and position RW, repeated cues for direction and safety; 2x modA to prevent LOB as pt with increased fatigue. Difficulty turning requiring cues to stay inside with turns   Stairs             Wheelchair Mobility    Modified Rankin (Stroke Patients Only)       Balance Overall balance assessment: Needs assistance   Sitting balance-Leahy Scale: Good       Standing balance-Leahy Scale: Poor Standing balance comment: Reliant on UE support; able to briefly maintain static standing without support at sink, losing balance with dynamic task requiring UE support and assist to correct                            Cognition Arousal/Alertness: Awake/alert Behavior During Therapy: [facility] for tasks assessed/performed Overall Cognitive Status: History of cognitive impairments - at baseline                                 General Comments: Very pleasant and agreeable to participate. Following simple commands, requiring frequent, repeated cues for direction and safety      Exercises      General Comments        Pertinent Vitals/Pain Pain Assessment: Faces Faces Pain Scale: Hurts a little bit Pain Location: L rib with coughing Pain  Descriptors / Indicators: Discomfort;Grimacing;Guarding Pain Intervention(s): Monitored during session    Home Living                      Prior Function            PT Goals (current goals can now be found in the care plan section) Progress towards PT goals: Progressing toward goals    Frequency    Min 2X/week      PT Plan  Frequency needs to be updated    Co-evaluation              AM-PAC PT "6 Clicks" Mobility   Outcome Measure  Help needed turning from your back to your side while in a flat bed without using bedrails?: None Help needed moving from lying on your back to sitting on the side of a flat bed without using bedrails?: A Little Help needed moving to and from a bed to a chair (including a wheelchair)?: A Little Help needed standing up from a chair using your arms (e.g., wheelchair or bedside chair)?: A Little Help needed to walk in hospital room?: A Lot Help needed climbing 3-5 steps with a railing? : A Lot 6 Click Score: 17    End of Session Equipment Utilized During Treatment: Gait belt Activity Tolerance: Patient tolerated treatment well Patient left: in bed;with call bell/phone within reach;with bed alarm set Nurse Communication: Mobility status PT Visit Diagnosis: Unsteadiness on feet (R26.81);Repeated falls (R29.6);History of falling (Z91.81)     Time: 1478-2956 PT Time Calculation (min) (ACUTE ONLY): 20 min  Charges:  $Gait Training: 8-22 mins                    Mabeline Caras, PT, DPT Acute Rehabilitation Services  Pager [phone] Office [place] 06/25/2019, 11:49 AM

## 2019-06-25 NOTE — Plan of Care (Signed)
  Problem: Clinical Measurements: Goal: Respiratory complications will improve Outcome: Progressing   Problem: Activity: Goal: Risk for activity intolerance will decrease Outcome: Progressing   Problem: Nutrition: Goal: Adequate nutrition will be maintained Outcome: Progressing   Problem: Pain Managment: Goal: General experience of comfort will improve Outcome: Progressing   Problem: Safety: Goal: Ability to remain free from injury will improve Outcome: Progressing   

## 2019-06-25 NOTE — TOC Progression Note (Signed)
Transition of Care [facility]) - Progression Note    Patient Details  Name: Jordan Sawyer MRN: [id number] Date of Birth: [birth year]  [facility]) CM/SW Contact  Epifanio Lesches, RN Phone Number: [phone] 06/25/2019, 9:42 AM  Clinical Narrative:    [facility] reviewing pt for SNF placement. Admission liaison states pt needs to be 48 hrs free of a sitter before they would extending bed offer and no behavior issues. Bed offer pending. MD made aware.   Expected Discharge Plan: Skilled Nursing Facility Barriers to Discharge: Continued Medical Work up  Expected Discharge Plan and Services Expected Discharge Plan: Skilled Nursing Facility In-house Referral: Clinical Social Work Discharge Planning Services: CM Consult Post Acute Care Choice: Skilled Nursing Facility Living arrangements for the past 2 months: Single Family Home Expected Discharge Date: 06/25/19                                     Social Determinants of Health (SDOH) Interventions    Readmission Risk Interventions Readmission Risk Prevention Plan 12/18/2018  Post Dischage Appt Not Complete  Appt Comments Office closed on 7/3  Medication Screening Complete  Transportation Screening Complete  Some recent data might be hidden

## 2019-06-25 NOTE — Progress Notes (Signed)
RN assessed patient's bruising on L side.  No new brown or black color.  Pain controlled.

## 2019-06-25 NOTE — Progress Notes (Signed)
At beginning of shift, pt had lucid conversation about his family, circumstances of his fall, and playing football. Pt was calm, ate well. Pt slept most of the night, and had one incident of taking off gown and attempting to leave the bed.

## 2019-06-25 NOTE — Progress Notes (Signed)
RN assessed pt's bruising on L side.  No new brown or black discoloration.  Bruising is extensive and purple.   Pt rated L rib pain 10/10.  PRN Toradol administered.

## 2019-06-25 NOTE — Progress Notes (Signed)
   Subjective:   Overnight the patient tried to get out of bed once but was redirectable and went to sleep. No other events overnight. Pt was seen at the bedside this AM and said he was sleepy. No complaints at this time.   Objective:  MRI Brain: IMPRESSION: No acute intracranial finding. Advanced chronic small-vessel ischemic changes throughout the brain as outlined above. Old small vessel infarctions of the right thalamus and right posterior basal ganglia/external capsule. Hemosiderin deposition associated with the old right thalamic infarction. No evidence of recent hemorrhage.  Vital signs in last 24 hours: Vitals:   06/24/19 0811 06/24/19 1605 06/24/19 2259 06/25/19 0600  BP: (!) 122/108 98/86 (!) 131/97   Pulse:  94 96   Resp:  15 18   Temp:  (!) 97.4 F (36.3 C) 97.8 F (36.6 C)   TempSrc:  Oral Axillary   SpO2:  97% 99%   Weight:    78 kg  Height:       Physical Exam General: Tired, frail male resting in bed HEENT: NCAT, EOMI CV: RRR, normal S1-S2 no murmurs rubs or gallops appreciated PULM: clear in all lung fields ABD: Soft and nontender in all quadrants NEURO: alert, but not oriented.No focal deficits appreciated  Assessment/Plan:  Principal Problem:   Neurological movement disorder Active Problems:   Agitation   Essential hypertension   Tobacco abuse   Frequent falls   Closed traumatic minimally displaced fracture of one rib of left side   Aortic atherosclerosis (HCC)  In summary, [name] is a 74 year old male with past medical history significant for dementia, CVA in June 2020, COPD, HTN, GERD, hypothyroidism, tobacco use disorder presents today after sustaining multiple falls over the last several months, more frequently this past week prior to admission, upper extremity myoclonic jerking. The patient is medically stable and ready for discharge.   #Hx CVA #Multiple Falls #Myoclonic jerks: Myoclonic jerking has improved since adding Klonopin 0.25  mg twice daily. EEG did not demonstrate any evidence of seizures. MRI demonstrated chronic small vessel ischemic changes throughout the brain as well as old small vessel infarctions of the right thalamus and right posterior basal ganglia.  -Neuro signed off, appreciate recs  -Continue Klonopin 0.25 mg disintegrating tablets BID for myoclonic jerks. Patient will need to follow-up in outpatient setting             -PT/OT/SLP consulted, will appreciate recommendations. Will need SNF placement. -Aspirin 81 mg daily  #Insomnia #Dementia #Aggitation: No agitation overnight. Pt did no require restraints, sedating meds, or sitter.  -Seroquel 25 mg nightly  -Start Ramelteon 8mg  nightly  -Attempt redirection PRN  #L 11th rib fx: Caused by fall prior to this hospitalization. Confirmed by CT of the chest on admission. Pt states pain is well controlled this AM. -Oxycodone 5 mg every 4 hours as needed -Tylenol 650 mg every 6 hours as needed  #COPD #Tobacco abuse -Nicotine patch 21 mg  #HTN: BP 150/102 today, and ranged from 160-176 systolic overnight. -Amlodipine 5 mg daily -Lisinopril 20 mg daily   #HLD -Lipitor 20 mg daily  #Hypothyroidism -Synthroid 100 mcg   #GERD -Protonix 40 mg   #FEN/GI -Diet: NPO, per SLP evaluation -Fluids: none  #DVT prophylaxis -SCDs  #CODE STATUS: FULL  #Dispo: Medically ready for discharge. Pt needs SNF placement upon d/c. Family wants the SNF to [place] in [place]. CSW working on placement.  [place], MD Internal Medicine, PGY1 Pager: [phone]  06/25/2019,7:58 AM

## 2019-06-26 DIAGNOSIS — G249 Dystonia, unspecified: Secondary | ICD-10-CM

## 2019-06-26 LAB — CBC
HCT: 36.2 % — ABNORMAL LOW (ref 39.0–52.0)
Hemoglobin: 12.2 g/dL — ABNORMAL LOW (ref 13.0–17.0)
MCH: 30.1 pg (ref 26.0–34.0)
MCHC: 33.7 g/dL (ref 30.0–36.0)
MCV: 89.4 fL (ref 80.0–100.0)
Platelets: 248 10*3/uL (ref 150–400)
RBC: 4.05 MIL/uL — ABNORMAL LOW (ref 4.22–5.81)
RDW: 13.2 % (ref 11.5–15.5)
WBC: 5.4 10*3/uL (ref 4.0–10.5)
nRBC: 0 % (ref 0.0–0.2)

## 2019-06-26 LAB — BASIC METABOLIC PANEL
Anion gap: 8 (ref 5–15)
BUN: 30 mg/dL — ABNORMAL HIGH (ref 8–23)
CO2: 25 mmol/L (ref 22–32)
Calcium: 8.5 mg/dL — ABNORMAL LOW (ref 8.9–10.3)
Chloride: 104 mmol/L (ref 98–111)
Creatinine, Ser: 1.23 mg/dL (ref 0.61–1.24)
GFR calc Af Amer: >60 mL/min (ref >60)
GFR calc non Af Amer: 57 mL/min — ABNORMAL LOW (ref >60)
Glucose, Bld: 90 mg/dL (ref 70–99)
Potassium: 3.6 mmol/L (ref 3.5–5.1)
Sodium: 137 mmol/L (ref 135–145)

## 2019-06-26 LAB — GLUCOSE, CAPILLARY: Glucose-Capillary: 101 mg/dL — ABNORMAL HIGH (ref 70–99)

## 2019-06-26 MED ORDER — QUETIAPINE FUMARATE 25 MG PO TABS: 12.5000 mg | ORAL_TABLET | Freq: Every day | ORAL | 3 refills | Status: DC

## 2019-06-26 NOTE — TOC Progression Note (Addendum)
Transition of Care [facility]) - Progression Note    Patient Details  Name: Jordan Sawyer MRN: [id number] Date of Birth: [birth year]  Transition of [facility]) CM/SW Contact  Eduard Roux, [place] Phone Number: 06/26/2019, 12:34 PM  Clinical Narrative:     Patient currently does not have bed placement.  CSW contacted [facility]- they have declined patient. Contacted [facility], noted the patient has been dischharged- waiting on response.   Sent referral to [facility]- waiting on response.   CSW was informed family wanted [facility] in [place]- SNF has declined patient.  Antony Blackbird, MSW, LCSWA Clinical Social Worker   Expected Discharge Plan: Skilled Nursing Facility Barriers to Discharge: Continued Medical Work up  Expected Discharge Plan and Services Expected Discharge Plan: Skilled Nursing Facility In-house Referral: Clinical Social Work Discharge Planning Services: CM Consult Post Acute Care Choice: Skilled Nursing Facility Living arrangements for the past 2 months: Single Family Home Expected Discharge Date: 06/26/19                                     Social Determinants of Health (SDOH) Interventions    Readmission Risk Interventions Readmission Risk Prevention Plan 12/18/2018  Post Dischage Appt Not Complete  Appt Comments Office closed on 7/3  Medication Screening Complete  Transportation Screening Complete  Some recent data might be hidden

## 2019-06-26 NOTE — Progress Notes (Signed)
   Subjective: HD#7   Overnight: No acute events  Today, Jordan Sawyer was examined while sitting up comfortably in bed eating breakfast.  He is doing well this morning and only complaint of pain at his left rib where he suffered his ground-level fall.  I updated him regarding his discharge plan and he expressed understanding.  Objective:  Vital signs in last 24 hours: Vitals:   06/25/19 0853 06/25/19 1000 06/25/19 1748 06/25/19 2329  BP: 127/74 106/61 102/66 (!) 175/82  Pulse: 88  85 64  Resp: 16  17 18   Temp:   98.9 F (37.2 C) 97.9 F (36.6 C)  TempSrc:   Oral   SpO2: 100%  99% 99%  Weight:      Height:       Const: In no apparent distress CV: RRR, no murmurs, gallop, rub  Assessment/Plan:  Principal Problem:   Neurological movement disorder Active Problems:   Agitation   Essential hypertension   Tobacco abuse   Frequent falls   Closed traumatic minimally displaced fracture of one rib of left side   Aortic atherosclerosis (HCC)   Myoclonic jerking  In summary,Jordan Sawyer is a 74 year old male with past medical history significant for dementia, 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 The patient is medically stable and ready for discharge.   #Hx CVA #MultipleFalls #Myoclonic jerks: Myoclonic jerking has improved since adding Klonopin 0.25 mg twice daily. EEG did not demonstrate any evidence of seizures. MRI demonstrated chronic small vessel ischemic changes throughout the brain as well as old small vessel infarctions of the right thalamus and right posterior basal ganglia.  -Neuro signed off, appreciate recs             -Continue Klonopin 0.25 mg disintegrating tablets BID for myoclonic jerks. Patient will need to follow-up in outpatient setting -PT/OT/SLP  consulted, will appreciate recommendations.  -Aspirin 81 mg daily -Stable to discharge to skilled nursing facility today  #Insomnia #Dementia #Aggitation:  -Seroquel 25 mg nightly now decreased to 12.5 mg nightly -Continue ramelteon 8mg  nightly  -Attempt redirection PRN  #L 11th rib 07-20-1973 by fall prior to this hospitalization.Confirmed by CT of the chest on admission. Pt states pain is well controlled this AM. -Oxycodone 5 mg every 4 hours as needed -Tylenol 650 mg every 6 hours as needed  #COPD #Tobacco abuse -Nicotine patch 21 mg  #HTN: -Amlodipine 5 mg daily -Lisinopril 20 mg daily   #HLD -Lipitor 20 mg daily  #Hypothyroidism -Synthroid 100 mcg   #GERD -Protonix 40 mg   #DVT prophylaxis -Lovenox  #CODE STATUS:FULL  #Dispo: Medically stable to discharge to skilled nursing facility today.  , MD 06/26/2019, 6:18 AM Pager: [phone] Internal Medicine Teaching Service

## 2019-06-27 LAB — GLUCOSE, CAPILLARY: Glucose-Capillary: 80 mg/dL (ref 70–99)

## 2019-06-27 NOTE — Progress Notes (Signed)
Pt very agitated attempting to leave and go home. Can not be redirected. MD on call notified.

## 2019-06-27 NOTE — Progress Notes (Signed)
Widespread ecchymotic area on left flank, rib cage area, and hip unchanged.  No open areas or induration noted.  Patient confused / oriented to self only.  Incontinent urine.

## 2019-06-27 NOTE — Progress Notes (Signed)
Bruise on left side of pt back, rib cage and hip reassessed. Unchanged at this time, no eschar, bleeding or signs of infection noted. Will continue to monitor.

## 2019-06-27 NOTE — Progress Notes (Signed)
Subjective:   Overnight: No events.  Pt was examined at the bedside this AM.  Patient was getting bathed by the nurses.  Very pleasant and states that he feels better today.  Pain in his rib is well controlled.  No other complaints.  Objective:  CBC Latest Ref Rng & Units 06/26/2019 06/25/2019 06/23/2019  WBC 4.0 - 10.5 K/uL 5.4 6.1 6.1  Hemoglobin 13.0 - 17.0 g/dL 12.2(L) 12.4(L) 12.1(L)  Hematocrit 39.0 - 52.0 % 36.2(L) 37.0(L) 35.8(L)  Platelets 150 - 400 K/uL 248 270 266   BMP Latest Ref Rng & Units 06/26/2019 06/25/2019 06/23/2019  Glucose 70 - 99 mg/dL 90 100(H) 104(H)  BUN 8 - 23 mg/dL 30(H) 20 11  Creatinine 0.61 - 1.24 mg/dL 1.23 1.13 1.03  Sodium 135 - 145 mmol/L 137 138 136  Potassium 3.5 - 5.1 mmol/L 3.6 3.8 3.9  Chloride 98 - 111 mmol/L 104 105 104  CO2 22 - 32 mmol/L 25 23 22   Calcium 8.9 - 10.3 mg/dL 8.5(L) 8.6(L) 8.7(L)   Vital signs in last 24 hours: Vitals:   06/25/19 1748 06/25/19 2329 06/26/19 0800 06/27/19 0036  BP: 102/66 (!) 175/82 131/86 (!) 145/86  Pulse: 85 64 78 89  Resp: 17 18 18 18   Temp: 98.9 F (37.2 C) 97.9 F (36.6 C) (!) 97.4 F (36.3 C) 97.9 F (36.6 C)  TempSrc: Oral Oral Oral Oral  SpO2: 99% 99% 98% 99%  Weight:      Height:       Physical Exam General: Laying in bed, NAD HEENT: NCAT CV: RRR, normal S1-S2 no murmurs rubs or gallops appreciated PULM: Clear to auscultation bilaterally, no crackles or wheezes ABD: Soft and nontender in all quadrants  NEURO: Alert, no focal deficits  Assessment/Plan:  Principal Problem:   Neurological movement disorder Active Problems:   Agitation   Essential hypertension   Tobacco abuse   Frequent falls   Closed traumatic minimally displaced fracture of one rib of left side   Aortic atherosclerosis (HCC)   Myoclonic jerking  In summary,Mr. Chelf is a 74 year old male with past medical history significant for dementia, CVA in June 2020,COPD, HTN, GERD,hypothyroidism, tobacco use  disorderpresents today after sustaining multiple fallsover the last several months, morefrequently this past week prior to admission,upper extremity myoclonic jerking. The patient is medically stable and ready for discharge.   #Hx CVA #MultipleFalls #Myoclonic jerks: Myoclonic jerking has improved since adding Klonopin 0.25 mg twice daily. EEG did not demonstrate any evidence of seizures. MRI demonstrated chronic small vessel ischemic changes throughout the brain as well as old small vessel infarctions of the right thalamus and right posterior basal ganglia.  -Neuro signed off, appreciate recs             -Continue Klonopin 0.25 mg disintegrating tablets BID for myoclonic jerks. Patient will need to follow-up in outpatient setting -PT/OT/SLP consulted, will appreciate recommendations.  -Aspirin 81 mg daily -Stable to discharge to skilled nursing facility today  #Insomnia #Dementia #Aggitation:  -Seroquel 12.5 mg nightly -Continue ramelteon 8mg  nightly  -Attempt redirection PRN  #L 11th rib NT:[id number] by fall prior to this hospitalization.Confirmed by CT of the chest on admission. Pt states pain is well controlled this AM. -Oxycodone 5 mg every 4 hours as needed -Tylenol 650 mg every 6 hours as needed  #COPD #Tobacco abuse -Nicotine patch 21 mg  #HTN: -Amlodipine 5 mg daily -Lisinopril 20 mg daily   #HLD -Lipitor 20 mg daily  #Hypothyroidism -Synthroid 100 mcg   #  GERD -Protonix 40 mg   #DVT prophylaxis -Lovenox  #CODE STATUS:FULL  #Dispo: Medically stable to discharge to skilled nursing facility today. Awaiting bed placement.  Kirt Boys, MD Internal Medicine, PGY1 Pager: [phone]  06/27/2019,10:04 AM

## 2019-06-28 DIAGNOSIS — R441 Visual hallucinations: Secondary | ICD-10-CM

## 2019-06-28 LAB — GLUCOSE, CAPILLARY: Glucose-Capillary: 101 mg/dL — ABNORMAL HIGH (ref 70–99)

## 2019-06-28 LAB — CBC
HCT: 40.9 % (ref 39.0–52.0)
Hemoglobin: 13.5 g/dL (ref 13.0–17.0)
MCH: 29.7 pg (ref 26.0–34.0)
MCHC: 33 g/dL (ref 30.0–36.0)
MCV: 89.9 fL (ref 80.0–100.0)
Platelets: 281 10*3/uL (ref 150–400)
RBC: 4.55 MIL/uL (ref 4.22–5.81)
RDW: 13.2 % (ref 11.5–15.5)
WBC: 5.7 10*3/uL (ref 4.0–10.5)
nRBC: 0 % (ref 0.0–0.2)

## 2019-06-28 LAB — BASIC METABOLIC PANEL
Anion gap: 7 (ref 5–15)
BUN: 17 mg/dL (ref 8–23)
CO2: 27 mmol/L (ref 22–32)
Calcium: 9.4 mg/dL (ref 8.9–10.3)
Chloride: 106 mmol/L (ref 98–111)
Creatinine, Ser: 1.15 mg/dL (ref 0.61–1.24)
GFR calc Af Amer: >60 mL/min (ref >60)
GFR calc non Af Amer: >60 mL/min (ref >60)
Glucose, Bld: 106 mg/dL — ABNORMAL HIGH (ref 70–99)
Potassium: 4.5 mmol/L (ref 3.5–5.1)
Sodium: 140 mmol/L (ref 135–145)

## 2019-06-28 MED ORDER — OLANZAPINE 5 MG PO TABS
5.0000 mg | ORAL_TABLET | Freq: Every day | ORAL | Status: DC
  Administered 2019-06-28 – 2019-07-01 (×4): 5 mg via ORAL
  Filled 2019-06-28 (×5): qty 1

## 2019-06-28 MED ORDER — OLANZAPINE 5 MG PO TABS: 5.0000 mg | ORAL_TABLET | Freq: Every day | ORAL | 3 refills | Status: DC

## 2019-06-28 NOTE — Care Management (Addendum)
12:00 Update on SNF placement:  [facility]- Left VM, awaiting callback [facility]- declined [facility]- declined [facility]- no beds until at least Wednesday, but will probably decline based on agitation at night. [facility]- No beds until at least Wednesday [facility]- LVM, awaiting callback [facility]- not accepting admissions  14:30 Spoke w daughter Barnetta Chapel. She would like to try [facility] in [place]. She provided me with administrator [facility]'s number [phone]. [facility] is able to see patient in the HUB. Facility is full until next week.  Mimi stats that she has observed several PT sessions. She states that with the level pf support that the patient needs her and her mother cannot provide that support at home. She states their plan is for short term rehab with a goal of him returning home after.

## 2019-06-28 NOTE — Progress Notes (Signed)
Received patient as a transfer from 2W. Patient alert to self and place, disoriented to time. Patient in low bed and floor mats in place. Call bell within reach and bed alarm is set. Will continue to monitor.

## 2019-06-28 NOTE — Progress Notes (Signed)
Physical Therapy Treatment Patient Details Name: Jordan Sawyer MRN: [id number] DOB: 12/31/1945 Today's Date: 06/28/2019    History of Present Illness Pt is a 74 y.o. male admitted 06/19/19 after fall at home; sustained 11th rib fx. EEG captured myoclonic jerks and started on klonopin. 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    PT Comments    Pt lethargic during session today limiting his mobility and safety with mobility. Mod A for bed mobility and sit<>stand transfers. Ambulated 5' in 3 bouts in different planes with RW and mod A. COntinue to recommend SNF for rehab. PT will continue to follow.    Follow Up Recommendations  SNF;Supervision for mobility/OOB     Equipment Recommendations  None recommended by PT    Recommendations for Other Services       Precautions / Restrictions Precautions Precautions: Fall Precaution Comments: history of frequent falls, posterior lean increases with fatigue Restrictions Weight Bearing Restrictions: No    Mobility  Bed Mobility Overal bed mobility: Needs Assistance Bed Mobility: Supine to Sit;Sit to Supine     Supine to sit: HOB elevated;Mod assist Sit to supine: Mod assist   General bed mobility comments: hand over hand guidance for pt to grasp bedrail. mod A for LE's off bed and elevation of trunk into sitting. Mod A to LE's for return to supine  Transfers Overall transfer level: Needs assistance Equipment used: Rolling walker (2 wheeled) Transfers: Sit to/from Stand Sit to Stand: Mod assist         General transfer comment: mod A for power up, cues for hand placement and activation of hip extensors. Pt maintained trunk flexion throughout standing  Ambulation/Gait Ambulation/Gait assistance: Mod assist Gait Distance (Feet): 5 Feet(3x) Assistive  device: Rolling walker (2 wheeled) Gait Pattern/deviations: Narrow base of support;Decreased stride length;Step-through pattern;Trunk flexed Gait velocity: Decreased Gait velocity interpretation: <1.31 ft/sec, indicative of household ambulator General Gait Details: pt needed vc's for each step and move of RW. Had difficulty advancing RLE. Practiced 5' fwd, 5' bkwd, and 5' L side stepping for strengthening. Pt had a difficult time with this today   Stairs             Wheelchair Mobility    Modified Rankin (Stroke Patients Only) Modified Rankin (Stroke Patients Only) Pre-Morbid Rankin Score: No symptoms Modified Rankin: Moderately severe disability     Balance Overall balance assessment: Needs assistance Sitting-balance support: Bilateral upper extremity supported;Feet supported Sitting balance-Leahy Scale: Good Sitting balance - Comments: min guard A static sitting   Standing balance support: Bilateral upper extremity supported;During functional activity;Single extremity supported Standing balance-Leahy Scale: Poor Standing balance comment: Reliant on UE support                            Cognition Arousal/Alertness: Lethargic Behavior During Therapy: Flat affect Overall Cognitive Status: History of cognitive impairments - at baseline                                 General Comments: all responses very slow today due to lethargy      Exercises General Exercises - Lower Extremity Ankle Circles/Pumps: AROM;Both;10 reps;Supine    General Comments General comments (skin integrity, edema, etc.): Let pt attempt to use urinal  in standing but he was unable to go.  daughter present.      Pertinent Vitals/Pain Pain Assessment: Faces Faces Pain Scale: Hurts a little bit Pain Location: L rib  Pain Descriptors / Indicators: Discomfort;Grimacing;Guarding Pain Intervention(s): Limited activity within patient's tolerance;Monitored during session     Home Living                      Prior Function            PT Goals (current goals can now be found in the care plan section) Acute Rehab PT Goals Patient Stated Goal: return home PT Goal Formulation: With patient Time For Goal Achievement: 07/05/19 Potential to Achieve Goals: Fair Progress towards PT goals: Not progressing toward goals - comment(lethargy)    Frequency    Min 2X/week      PT Plan Frequency needs to be updated    Co-evaluation              AM-PAC PT "6 Clicks" Mobility   Outcome Measure  Help needed turning from your back to your side while in a flat bed without using bedrails?: A Little Help needed moving from lying on your back to sitting on the side of a flat bed without using bedrails?: A Lot Help needed moving to and from a bed to a chair (including a wheelchair)?: A Lot Help needed standing up from a chair using your arms (e.g., wheelchair or bedside chair)?: A Lot Help needed to walk in hospital room?: A Lot Help needed climbing 3-5 steps with a railing? : Total 6 Click Score: 12    End of Session Equipment Utilized During Treatment: Gait belt Activity Tolerance: Patient limited by lethargy Patient left: in bed;with call bell/phone within reach;with bed alarm set;with family/visitor present Nurse Communication: Mobility status PT Visit Diagnosis: Unsteadiness on feet (R26.81);Repeated falls (R29.6);History of falling (Z91.81) Hemiplegia - Right/Left: Left Hemiplegia - dominant/non-dominant: Non-dominant Hemiplegia - caused by: Cerebral infarction     Time: 1153-1222 PT Time Calculation (min) (ACUTE ONLY): 29 min  Charges:  $Gait Training: 8-22 mins $Therapeutic Activity: 8-22 mins                     Lyanne Co, PT  Acute Rehab Services  Pager [phone] Office [phone]    Jordan Sawyer 06/28/2019, 2:07 PM

## 2019-06-28 NOTE — Progress Notes (Signed)
Nutrition Brief Note  Patient identified on the Malnutrition Screening Tool (MST) Report -1/9  Pt admitted on 1/2. Pt was placed on a dysphagia 1 diet on 1/6, last PO documented was 100%.   Per MD note, pt is stable for discharge. Awaiting SNF placement.   Wt Readings from Last 15 Encounters:  06/25/19 78 kg  12/16/18 81 kg    Body mass index is 24.67 kg/m. Patient meets criteria for normal based on current BMI.   Current diet order is dysphagia 1, patient is consuming approximately 100% of meals at this time. Labs and medications reviewed.   No nutrition interventions warranted at this time. If nutrition issues arise, please consult RD.   Tilda Franco, MS, RD, LDN Inpatient Clinical Dietitian Pager: [phone] After Hours Pager: [phone]

## 2019-06-28 NOTE — Progress Notes (Signed)
Subjective:   O/N: Pt was difficult to redirect and tried to get out of bed multiple times. The staff was able to get him back in bed with extensive redirection. No additional medications, sitter, or restraints had to be used.  Pt was examined at the bedside this AM. Pt is pleasant this AM. States that he is doing well. He mentions that he sees people in his room walking around, and asked if we could bring a bottle of liquor for [place] to drink together. All concerns were addressed.  Objective:  CBC Latest Ref Rng & Units 06/28/2019 06/26/2019 06/25/2019  WBC 4.0 - 10.5 K/uL 5.7 5.4 6.1  Hemoglobin 13.0 - 17.0 g/dL 42.7 12.2(L) 12.4(L)  Hematocrit 39.0 - 52.0 % 40.9 36.2(L) 37.0(L)  Platelets 150 - 400 K/uL 281 248 270   BMP Latest Ref Rng & Units 06/28/2019 06/26/2019 06/25/2019  Glucose 70 - 99 mg/dL 062(B) 90 762(G)  BUN 8 - 23 mg/dL 17 31(D) 20  Creatinine 0.61 - 1.24 mg/dL 1.76 1.60 7.37  Sodium 135 - 145 mmol/L 140 137 138  Potassium 3.5 - 5.1 mmol/L 4.5 3.6 3.8  Chloride 98 - 111 mmol/L 106 104 105  CO2 22 - 32 mmol/L 27 25 23   Calcium 8.9 - 10.3 mg/dL 9.4 ) 1.0(G)   Vital signs in last 24 hours: Vitals:   06/27/19 0036 06/27/19 0830 06/27/19 1750 06/28/19 0115  BP: (!) 145/86 (!) 143/82 (!) 145/88 136/84  Pulse: 89 84 81 85  Resp: 18 20 19 19   Temp: 97.9 F (36.6 C) 98 F (36.7 C) 98 F (36.7 C) 98.2 F (36.8 C)  TempSrc: Oral Oral Oral Oral  SpO2: 99% 99%  97%  Weight:      Height:       Physical Exam General: Sitting up in bed, NAD HEENT: NCAT CV: RRR, normal S1-S2 no murmurs rubs or gallops appreciated PULM: Clear to auscultation bilaterally, no crackles or wheezes ABD: Soft and nontender in all quadrants NEURO: Alert, no focal deficits PSYCH: Pt sees people around the room that aren't there. He does not seem to be in distress.  Assessment/Plan:  Principal Problem:   Neurological movement disorder Active Problems:   Agitation   Essential hypertension  Tobacco abuse   Frequent falls   Closed traumatic minimally displaced fracture of one rib of left side   Aortic atherosclerosis (HCC)   Myoclonic jerking  In summary,Jordan Sawyer is a 74 year old male with past medical history significant for dementia, 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 The patient is medically stable and ready for discharge.   #Hx CVA #MultipleFalls #Myoclonic jerks: Myoclonic jerking has improved since adding Klonopin 0.25 mg twice daily. EEG did not demonstrate any evidence of seizures. MRI demonstrated chronic small vessel ischemic changes throughout the brain as well as old small vessel infarctions of the right thalamus and right posterior basal ganglia.  -Neuro signed off, appreciate recs             -Continue Klonopin 0.25 mg disintegrating tablets BID for myoclonic jerks. Patient will need to follow-up in outpatient setting -PT/OT/SLP consulted, recommend SNF. -Aspirin 81 mg daily -Stable to discharge to skilled nursing facility today  #Insomnia #Dementia #Aggitation:  -d/c Seroquel 12.5 mg nightly, start Zyprexa 5 mg nightly -Continue ramelteon 8mg  nightly  -Attempt redirection PRN  #Visual Hallucinations: Pt states that he  is seeing people around this room, but it doesn't appear to cause him any distress. Pt hasn't endorsed seeing people around the room prior to today,. -Continue to monitor  -Start Zyprexa 5 mg nightly as noted above  #L 11th rib QX:IHWTUU by fall prior to this hospitalization.Confirmed by CT of the chest on admission. Pt states pain is well controlled this AM. -Oxycodone 5 mg every 4 hours as needed -Tylenol 650 mg every 6 hours as needed  #COPD #Tobacco abuse -Nicotine patch 21 mg  #HTN: -Amlodipine 5 mg  daily -Lisinopril 20 mg daily   #HLD -Lipitor 20 mg daily  #Hypothyroidism -Synthroid 100 mcg   #GERD -Protonix 40 mg   #DVT prophylaxis -Lovenox 40 mg subq injections daily  #CODE STATUS:FULL  #Dispo: Medically stable to discharge to skilled nursing facility today. Awaiting bed placement.  Earlene Plater, MD Internal Medicine, PGY1 Pager: [phone]  06/28/2019,9:51 AM

## 2019-06-28 NOTE — Plan of Care (Signed)
  Problem: Clinical Measurements: Goal: Respiratory complications will improve Outcome: Progressing Goal: Cardiovascular complication will be avoided Outcome: Progressing   Problem: Nutrition: Goal: Adequate nutrition will be maintained Outcome: Progressing   Problem: Pain Managment: Goal: General experience of comfort will improve Outcome: Progressing   

## 2019-06-28 NOTE — Progress Notes (Signed)
OT Cancellation Note  Patient Details Name: Jordan Sawyer MRN: [id number] DOB: 1945/09/16   Cancelled Treatment:    Reason Eval/Treat Not Completed: Fatigue/lethargy limiting ability to participate. Patient lethargic and unable to participate in OT session, daughter reports patient having days and nights mixed up- educated on daytime stimulation. Will follow and see as able.   Barry Brunner, OT Acute Rehabilitation Services Pager [phone] Office [phone]    Chancy Milroy 06/28/2019, 1:53 PM

## 2019-06-28 NOTE — Progress Notes (Signed)
RN verified with Lawerance Sabal RN that pt currently does not have a facility to discharge to despite MD placing discharge orders. RN paged MD and he clarified the orders have been placed so that when there is a facility ready to take pt that orders are already in.   RN will continue monitor pt.

## 2019-06-29 LAB — GLUCOSE, CAPILLARY
Glucose-Capillary: 94 mg/dL (ref 70–99)
Glucose-Capillary: 86 mg/dL (ref 70–99)

## 2019-06-29 LAB — CBC
HCT: 39.3 % (ref 39.0–52.0)
Hemoglobin: 12.7 g/dL — ABNORMAL LOW (ref 13.0–17.0)
MCH: 29.6 pg (ref 26.0–34.0)
MCHC: 32.3 g/dL (ref 30.0–36.0)
MCV: 91.6 fL (ref 80.0–100.0)
Platelets: 272 10*3/uL (ref 150–400)
RBC: 4.29 MIL/uL (ref 4.22–5.81)
RDW: 13.4 % (ref 11.5–15.5)
WBC: 5.2 10*3/uL (ref 4.0–10.5)
nRBC: 0 % (ref 0.0–0.2)

## 2019-06-29 LAB — BASIC METABOLIC PANEL
Anion gap: 10 (ref 5–15)
BUN: 25 mg/dL — ABNORMAL HIGH (ref 8–23)
CO2: 23 mmol/L (ref 22–32)
Calcium: 8.8 mg/dL — ABNORMAL LOW (ref 8.9–10.3)
Chloride: 106 mmol/L (ref 98–111)
Creatinine, Ser: 1.19 mg/dL (ref 0.61–1.24)
GFR calc Af Amer: >60 mL/min (ref >60)
GFR calc non Af Amer: 59 mL/min — ABNORMAL LOW (ref >60)
Glucose, Bld: 102 mg/dL — ABNORMAL HIGH (ref 70–99)
Potassium: 3.8 mmol/L (ref 3.5–5.1)
Sodium: 139 mmol/L (ref 135–145)

## 2019-06-29 MED ORDER — ADULT MULTIVITAMIN W/MINERALS CH
1.0000 | ORAL_TABLET | Freq: Every day | ORAL | Status: DC
  Administered 2019-06-29 – 2019-07-07 (×8): 1 via ORAL
  Filled 2019-06-29 (×8): qty 1

## 2019-06-29 NOTE — Progress Notes (Signed)
Initial Nutrition Assessment  DOCUMENTATION CODES:   Not applicable  INTERVENTION:   -Magic cup TID with meals, each supplement provides 290 kcal and 9 grams of protein -MVI with minerals daily -Feeding assistance with meals  NUTRITION DIAGNOSIS:   Increased nutrient needs related to chronic illness(COPD) as evidenced by estimated needs.  GOAL:   Patient will meet greater than or equal to 90% of their needs  MONITOR:   PO intake, Supplement acceptance, Diet advancement, Labs, Weight trends, Skin, I & O's  REASON FOR ASSESSMENT:   Malnutrition Screening Tool    ASSESSMENT:   Mr. Scripter is a 74 year old male with past medical history significant for dementia, CVA in June 2020, COPD, HTN, GERD, hypothyroidism, tobacco use disorder presents today after sustaining multiple falls and jerking.  Pt admitted with frequent falls.   1/6- s/p BSE- downgraded to dysphagia 1 diet with thin liquids   Spoke with pt at bedside, who was pleasant, but provided limited history. Pt reports appetite was good PTA and consumed "at least 2-3 meals per day". Pt reports that he ate "whatever I wanted" and denies being on a pureed diet PTA. However, RD suspects inaccuracy of information secondary to dementia.   Reviewed wt hx; noted pt has experienced a 3.7% wt loss over the past 6 months, which is not significant for time frame. Pt shares that his UBW is around 190# and estimates he has lost about 5 pounds "because I'm not eating enough". Pt fixated on his lunch tray, however, unable to access, so RD assisted with set-up. Pt will require feeding assistance with meals. Noted meal completion 100%.   Per RNCM notes, pt d/c has been cancelled as no SNF bed currently available at this time. Pt will require SNF at discharge.   Labs reviewed: CBGS: 94-101.   NUTRITION - FOCUSED PHYSICAL EXAM:    Diet Order:   Diet Order            Diet - low sodium heart healthy        Diet - low sodium heart  healthy        Diet - low sodium heart healthy        DIET - DYS 1 Room service appropriate? Yes; Fluid consistency: Thin  Diet effective now              EDUCATION NEEDS:   No education needs have been identified at this time  Skin:  Skin Assessment: Reviewed RN Assessment  Last BM:  06/24/19  Height:   Ht Readings from Last 1 Encounters:  06/19/19 5\' 10"  (1.778 m)    Weight:   Wt Readings from Last 1 Encounters:  06/25/19 78 kg    Ideal Body Weight:  75.5 kg  BMI:  Body mass index is 24.67 kg/m.  Estimated Nutritional Needs:   Kcal:  1700-1900  Protein:  85-100 grams  Fluid:  >1.7 L    Daneka Lantigua A. 08/23/19, RD, LDN, CDCES Registered Dietitian II Certified Diabetes Care and Education Specialist Pager: [phone] After hours Pager: [phone]

## 2019-06-29 NOTE — Progress Notes (Signed)
Subjective:  O/N: No events  Pt was examined at the bedside this AM.  PT was sleeping upon entering the room.  Woke up to the call of his name but appears tired.  States that he does not have any pain and is comfortable at the moment.  No complaints at this time.   Objective:  CBC Latest Ref Rng & Units 06/29/2019 06/28/2019 06/26/2019  WBC 4.0 - 10.5 K/uL 5.2 5.7 5.4  Hemoglobin 13.0 - 17.0 g/dL 12.7(L) 13.5 12.2(L)  Hematocrit 39.0 - 52.0 % 39.3 40.9 36.2(L)  Platelets 150 - 400 K/uL 272 281 248   BMP Latest Ref Rng & Units 06/29/2019 06/28/2019 06/26/2019  Glucose 70 - 99 mg/dL 349(Z) 791(T) 90  BUN 8 - 23 mg/dL 05(W) 17 97(X)  Creatinine 0.61 - 1.24 mg/dL 4.80 1.65 5.37  Sodium 135 - 145 mmol/L 139 140 137  Potassium 3.5 - 5.1 mmol/L 3.8 4.5 3.6  Chloride 98 - 111 mmol/L 106 106 104  CO2 22 - 32 mmol/L 23 27 25   Calcium 8.9 - 10.3 mg/dL ) 9.4 4.8(O)   Vital signs in last 24 hours: Vitals:   06/28/19 1748 06/28/19 1836 06/28/19 2056 06/29/19 0413  BP: 127/77 119/74 (!) 158/85 (!) 150/76  Pulse: 82 78 81 87  Resp: 17 18 18 20   Temp:  (!) 97.5 F (36.4 C) 97.7 F (36.5 C) 97.6 F (36.4 C)  TempSrc:  Oral Oral   SpO2: 99% 100% 100% 99%  Weight:      Height:       Physical Exam General: Lying in bed, sleepy, NAD HEENT: NCAT CV: RRR, normal S1-S2 no murmurs rubs or gallops appreciated PULM: Clear to auscultation bilaterally, no crackles or wheezes ABD: Soft and nontender NEURO: No focal deficits  Assessment/Plan:  Principal Problem:   Neurological movement disorder Active Problems:   Agitation   Essential hypertension   Tobacco abuse   Frequent falls   Closed traumatic minimally displaced fracture of one rib of left side   Aortic atherosclerosis (HCC)   Myoclonic jerking  In summary,Mr. Maye is a 74 year old male with past medical history significant for dementia, CVA in June 2020,COPD, HTN, GERD,hypothyroidism, tobacco use disorderpresents today  after sustaining multiple fallsover the last several months, morefrequently this past week prior to admission,upper extremity myoclonic jerking. The patient is medically stable and ready for discharge.   #Hx CVA #MultipleFalls #Myoclonic jerks: Myoclonic jerking has improved since adding Klonopin 0.25 mg twice daily. EEG did not demonstrate any evidence of seizures. MRI demonstrated chronic small vessel ischemic changes throughout the brain as well as old small vessel infarctions of the right thalamus and right posterior basal ganglia.  -Neuro signed off, appreciate recs             -Continue Klonopin 0.25 mg disintegrating tablets BID for myoclonic jerks. Patient will need to follow-up in outpatient setting -PT/OT/SLP consulted, recommend SNF. -Aspirin 81 mg daily -Stable to discharge to skilled nursing facility today  #Insomnia #Dementia #Aggitation: Pt had no agitation overnight. Will continue to monitor and titrate medications appropriately  -Zyprexa 5 mg nightly -Continue ramelteon 8mg  nightly  -Attempt redirection PRN  #Visual Hallucinations: Pt states that he is seeing people around this room, but it doesn't appear to cause him any distress. Pt hasn't endorsed seeing people around the room prior to today,. -Continue to monitor  -Zyprexa 5 mg nightly   #L 11th rib 61 by fall prior to this hospitalization.Confirmed by CT of the chest  on admission. Pt states pain is well controlled this AM. -Oxycodone 5 mg every 4 hours as needed -Tylenol 650 mg every 6 hours as needed  #COPD #Tobacco abuse -Nicotine patch 21 mg  #HTN: -Amlodipine 5 mg daily -Lisinopril 20 mg daily   #HLD -Lipitor 20 mg daily  #Hypothyroidism -Synthroid 100 mcg   #GERD -Protonix 40 mg   #DVT prophylaxis -Lovenox 40 mg subq injections daily  #CODE STATUS:FULL  #Dispo: Medically stable to discharge to skilled nursing facility today. Awaiting bed placement. Will  continue to be in touch with CSW.  Earlene Plater, MD Internal Medicine, PGY1 Pager: [phone]  06/29/2019,9:52 AM

## 2019-06-29 NOTE — Plan of Care (Signed)
  Problem: Clinical Measurements: Goal: Respiratory complications will improve Outcome: Progressing Goal: Cardiovascular complication will be avoided Outcome: Progressing   Problem: Nutrition: Goal: Adequate nutrition will be maintained Outcome: Progressing   Problem: Pain Managment: Goal: General experience of comfort will improve Outcome: Progressing   Problem: Safety: Goal: Ability to remain free from injury will improve Outcome: Progressing   

## 2019-06-29 NOTE — TOC Progression Note (Signed)
Transition of Care [facility]) - Progression Note    Patient Details  Name: Jordan Sawyer MRN: [id number] Date of Birth: Sep 07, 1945  Transition of Care [facility] CM/SW Contact  Doy Hutching, [place] Phone Number: 06/29/2019, 11:19 AM  Clinical Narrative:    Acknowledging pt arrival from 2W.  At current time pt has an offer from [facility] but unable to assess if it is actually an offer, RNCM Ronny Flurry has sent message to admissions liaison regarding availability. Otherwise no current offers. [facility] in [place] has reviewed per [facility] Debbie's notes yesterday but no current beds. TOC CSW has spoken with MD and requested that discharge order be discontinued as we continue search for bed. Discussed barriers to placement that currently exist.    Expected Discharge Plan: Skilled Nursing Facility Barriers to Discharge: Continued Medical Work up  Expected Discharge Plan and Services Expected Discharge Plan: Skilled Nursing Facility In-house Referral: Clinical Social Work Discharge Planning Services: CM Consult Post Acute Care Choice: Skilled Nursing Facility Living arrangements for the past 2 months: Single Family Home Expected Discharge Date: 06/28/19                 Readmission Risk Interventions Readmission Risk Prevention Plan 12/18/2018  Post Dischage Appt Not Complete  Appt Comments Office closed on 7/3  Medication Screening Complete  Transportation Screening Complete  Some recent data might be hidden

## 2019-06-30 LAB — GLUCOSE, CAPILLARY: Glucose-Capillary: 86 mg/dL (ref 70–99)

## 2019-06-30 NOTE — Progress Notes (Signed)
Occupational Therapy Treatment Patient Details Name: Jordan Sawyer MRN: [id number] DOB: 1945/12/23 Today's Date: 06/30/2019    History of present illness Pt is a 74 y.o. male admitted 06/19/19 after fall at home; sustained 11th rib fx. EEG captured myoclonic jerks and started on klonopin. 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   OT comments  Pt making progress with functional goals. continues to be pleasantly confused with impaired safety awareness. Min guard A with UB ADLs, mod A with LB ADLs. Mod A to transfer with RW, mod A  for power up, cues for hand placement and to keep hands on RW. OT will continue to follow acutely  Follow Up Recommendations  SNF;Supervision/Assistance - 24 hour    Equipment Recommendations  3 in 1 bedside commode    Recommendations for Other Services      Precautions / Restrictions Precautions Precautions: Fall Precaution Comments: history of frequent falls, posterior lean increases with fatigue Restrictions Weight Bearing Restrictions: No       Mobility Bed Mobility               General bed mobility comments: pt in recliner upon arrival  Transfers Overall transfer level: Needs assistance Equipment used: Rolling walker (2 wheeled) Transfers: Sit to/from Stand Sit to Stand: Mod assist         General transfer comment: mod A for power up, cues for hand placement and to keep hands on RW    Balance Overall balance assessment: Needs assistance Sitting-balance support: Bilateral upper extremity supported;Feet supported Sitting balance-Leahy Scale: Good Sitting balance - Comments: min guard A static sitting   Standing balance support: Bilateral upper extremity supported;During functional activity;Single extremity supported Standing balance-Leahy Scale: Poor                              ADL either performed or assessed with clinical judgement   ADL Overall ADL's : Needs assistance/impaired Eating/Feeding: Set up;Sitting   Grooming: Wash/dry hands;Wash/dry face;Min guard;Sitting       Lower Body Bathing: Sit to/from stand;Moderate assistance;Minimal assistance   Upper Body Dressing : Min guard;Sitting   Lower Body Dressing: Moderate assistance;Cueing for back precautions   Toilet Transfer: Moderate assistance;RW;Cueing for safety;Cueing for sequencing Toilet Transfer Details (indicate cue type and reason): simulated recliner t bed and back to recliner Toileting- Clothing Manipulation and Hygiene: Moderate assistance;Sit to/from stand       Functional mobility during ADLs: Moderate assistance;Rolling walker;Cueing for sequencing;Cueing for safety       Vision Baseline Vision/History: Wears glasses Wears Glasses: At all times Patient Visual Report: No change from baseline     Perception     Praxis      Cognition Arousal/Alertness: Lethargic Behavior During Therapy: WFL for tasks assessed/performed Overall Cognitive Status: History of cognitive impairments - at baseline Area of Impairment: Safety/judgement;Following commands;Memory;Problem solving;Awareness                     Memory: Decreased short-term memory Following Commands: Follows one step commands inconsistently Safety/Judgement: Decreased awareness of safety;Decreased awareness of deficits   Problem Solving: Requires verbal cues;Requires tactile cues;Difficulty sequencing          Exercises     Shoulder Instructions       General Comments  Pertinent Vitals/ Pain       Pain Assessment: Faces Faces Pain Scale: Hurts a little bit Pain Location: L rib  Pain Descriptors / Indicators: Discomfort;Grimacing;Guarding Pain Intervention(s): Limited activity within patient's tolerance;Monitored during session;Repositioned  Home Living                                           Prior Functioning/Environment              Frequency  Min 2X/week        Progress Toward Goals  OT Goals(current goals can now be found in the care plan section)  Progress towards OT goals: Progressing toward goals  Acute Rehab OT Goals Patient Stated Goal: return home  Plan Discharge plan remains appropriate    Co-evaluation                 AM-PAC OT "6 Clicks" Daily Activity     Outcome Measure   Help from another person eating meals?: None Help from another person taking care of personal grooming?: A Little Help from another person toileting, which includes using toliet, bedpan, or urinal?: A Lot Help from another person bathing (including washing, rinsing, drying)?: A Lot Help from another person to put on and taking off regular upper body clothing?: A Little Help from another person to put on and taking off regular lower body clothing?: A Little 6 Click Score: 17    End of Session Equipment Utilized During Treatment: Gait belt;Rolling walker  OT Visit Diagnosis: Unsteadiness on feet (R26.81);Other abnormalities of gait and mobility (R26.89);Muscle weakness (generalized) (M62.81);History of falling (Z91.81);Pain Pain - part of body: (ribs, generalized)   Activity Tolerance Patient tolerated treatment well   Patient Left in chair;with call bell/phone within reach;with chair alarm set   Nurse Communication          Time: 0086-7619 OT Time Calculation (min): 28 min  Charges: OT General Charges $OT Visit: 1 Visit OT Treatments $Self Care/Home Management : 8-22 mins $Therapeutic Activity: 8-22 mins     Galen Manila 06/30/2019, 1:11 PM

## 2019-06-30 NOTE — Progress Notes (Addendum)
Subjective:   O/N: No events  Pt was examined at the bedside this AM. Pt is pleasant this AM. States he no longer has rib pain at this time. Denies having any SOB, chest pain or difficulties with his bowel and bladder habits. All concerns were addressed.   Objective:  CBC Latest Ref Rng & Units 06/29/2019 06/28/2019 06/26/2019  WBC 4.0 - 10.5 K/uL 5.2 5.7 5.4  Hemoglobin 13.0 - 17.0 g/dL 12.7(L) 13.5 12.2(L)  Hematocrit 39.0 - 52.0 % 39.3 40.9 36.2(L)  Platelets 150 - 400 K/uL 272 281 248   BMP Latest Ref Rng & Units 06/29/2019 06/28/2019 06/26/2019  Glucose 70 - 99 mg/dL 976(B) 341(P) 90  BUN 8 - 23 mg/dL 37(T) 17 02(I)  Creatinine 0.61 - 1.24 mg/dL 0.97 3.53 2.99  Sodium 135 - 145 mmol/L 139 140 137  Potassium 3.5 - 5.1 mmol/L 3.8 4.5 3.6  Chloride 98 - 111 mmol/L 106 106 104  CO2 22 - 32 mmol/L 23 27 25   Calcium 8.9 - 10.3 mg/dL ) 9.4 2.4(Q)   Vital signs in last 24 hours: Vitals:   06/29/19 0413 06/29/19 1357 06/29/19 2014 06/30/19 0432  BP: (!) 150/76 121/74 126/70 135/71  Pulse: 87 86 84 63  Resp: 20 17 20 20   Temp: 97.6 F (36.4 C) 97.6 F (36.4 C) 97.6 F (36.4 C) 97.6 F (36.4 C)  TempSrc:  Oral    SpO2: 99% 98% 97% 96%  Weight:      Height:       Physical Exam General: Lying in bed, sleepy, NAD HEENT: NCAT CV: RRR, normal S1-S2 no murmurs rubs or gallops appreciated PULM: Clear to auscultation bilaterally, no crackles or wheezes ABD: Soft and nontender NEURO: No focal deficits  Assessment/Plan:  Principal Problem:   Neurological movement disorder Active Problems:   Agitation   Essential hypertension   Tobacco abuse   Frequent falls   Closed traumatic minimally displaced fracture of one rib of left side   Aortic atherosclerosis (HCC)   Myoclonic jerking  In summary,Jordan Sawyer is a 74 year old male with past medical history significant for dementia, CVA in June 2020,COPD, HTN, GERD,hypothyroidism, tobacco use disorderpresents today after  sustaining multiple fallsover the last several months, morefrequently this past week prior to admission,upper extremity myoclonic jerking. The patient is medically stable and ready for discharge.   #Hx CVA #MultipleFalls #Myoclonic jerks: Myoclonic jerking has improved since adding Klonopin 0.25 mg twice daily. EEG did not demonstrate any evidence of seizures. MRI demonstrated chronic small vessel ischemic changes throughout the brain as well as old small vessel infarctions of the right thalamus and right posterior basal ganglia.  -Neuro signed off, appreciate recs             -Continue Klonopin 0.25 mg disintegrating tablets BID for myoclonic jerks. Patient will need to follow-up in outpatient setting -PT/OT/SLP consulted, recommend SNF. -Aspirin 81 mg daily -Stable to discharge to skilled nursing facility today  #Insomnia #Dementia #Aggitation: Pt had no agitation overnight. Will continue to monitor and titrate medications appropriately  -Zyprexa 5 mg nightly -Continue ramelteon 8mg  nightly  -Attempt redirection PRN  #Visual Hallucinations: Pt states that he is seeing people around this room, but it doesn't appear to cause him any distress. Pt hasn't endorsed seeing people around the room prior to today,. -Continue to monitor  -Zyprexa 5 mg nightly   #L 11th rib 61 by fall prior to this hospitalization.Confirmed by CT of the chest on admission. Pt states pain is  well controlled. -Tylenol 650 mg every 6 hours as needed  #COPD #Tobacco abuse -Nicotine patch 21 mg  #HTN: -Amlodipine 5 mg daily -Lisinopril 20 mg daily   #HLD -Lipitor 20 mg daily  #Hypothyroidism -Synthroid 100 mcg   #GERD -Protonix 40 mg   #DVT prophylaxis -Lovenox 40 mg subq injections daily  #CODE STATUS:FULL  #Dispo: Medically stable to discharge to skilled nursing facility today. Awaiting bed placement. Will continue to be in touch with CSW.  Earlene Plater,  MD Internal Medicine, PGY1 Pager: [phone]  06/30/2019,7:13 AM

## 2019-06-30 NOTE — Progress Notes (Signed)
Physical Therapy Treatment Patient Details Name: Jordan Sawyer MRN: [id number] DOB: [birth year] Today's Date: 06/30/2019    History of Present Illness Pt is a 74 y.o. male admitted 06/19/19 after fall at home; sustained 11th rib fx. EEG captured myoclonic jerks and started on klonopin. 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    PT Comments    Pt able to ambulate to hallway with mod A +2 for safety and stability. Pt becomes uncoordinated with gait as he fatigues requiring additional assist for progression of LLE and for balance. Patient would benefit from continued skilled PT to maximize functional independence and safety with mobility. Will continue to follow acutely.    Follow Up Recommendations  SNF;Supervision for mobility/OOB     Equipment Recommendations  None recommended by PT    Recommendations for Other Services       Precautions / Restrictions Precautions Precautions: Fall Precaution Comments: history of frequent falls, posterior lean increases with fatigue Restrictions Weight Bearing Restrictions: No    Mobility  Bed Mobility               General bed mobility comments: in chair on arrival  Transfers Overall transfer level: Needs assistance Equipment used: Rolling walker (2 wheeled) Transfers: Sit to/from Stand Sit to Stand: +2 physical assistance;Mod assist         General transfer comment: mod A to power up. Frequent cues for hand placement, wide BOS, and forward weight shift.  Sit<>stand performed 5x.  Ambulation/Gait Ambulation/Gait assistance: Mod assist;Min assist Gait Distance (Feet): 15 Feet(x2) Assistive device: Rolling walker (2 wheeled) Gait Pattern/deviations: Narrow base of support;Decreased stride length;Step-through pattern;Trunk flexed Gait velocity:  Decreased   General Gait Details: VC for postural control and RW proximity. Pt required min A for balance with ambulation and at times mod A as pt fatigued and required assist to progress LLE forward.   Stairs             Wheelchair Mobility    Modified Rankin (Stroke Patients Only) Modified Rankin (Stroke Patients Only) Pre-Morbid Rankin Score: No symptoms Modified Rankin: Moderately severe disability     Balance Overall balance assessment: Needs assistance Sitting-balance support: Bilateral upper extremity supported;Feet supported Sitting balance-Leahy Scale: Good Sitting balance - Comments: min guard A static sitting   Standing balance support: Bilateral upper extremity supported;During functional activity;Single extremity supported Standing balance-Leahy Scale: Poor Standing balance comment: Reliant on UE support                            Cognition Arousal/Alertness: Awake/alert Behavior During Therapy: WFL for tasks assessed/performed Overall Cognitive Status: History of cognitive impairments - at baseline Area of Impairment: Safety/judgement;Following commands;Memory;Problem solving;Awareness                     Memory: Decreased short-term memory Following Commands: Follows one step commands inconsistently Safety/Judgement: Decreased awareness of safety;Decreased awareness of deficits   Problem Solving: Requires verbal cues;Requires tactile cues;Difficulty sequencing General Comments: Pt with dificulty with short term memory. Had just completed session with OT but did not recall working with therapist.       Exercises General Exercises - Lower Extremity Long Arc Quad: AROM;Both;10 reps;Seated Hip Flexion/Marching: AROM;Both;10 reps;Seated    General Comments        Pertinent Vitals/Pain  Pain Assessment: No/denies pain Faces Pain Scale: Hurts a little bit Pain Location: L rib  Pain Descriptors / Indicators:  Discomfort;Grimacing;Guarding Pain Intervention(s): Limited activity within patient's tolerance;Monitored during session;Repositioned    Home Living                      Prior Function            PT Goals (current goals can now be found in the care plan section) Acute Rehab PT Goals Patient Stated Goal: return home PT Goal Formulation: With patient Time For Goal Achievement: 07/05/19 Potential to Achieve Goals: Fair Progress towards PT goals: Progressing toward goals    Frequency    Min 2X/week      PT Plan Frequency needs to be updated    Co-evaluation              AM-PAC PT "6 Clicks" Mobility   Outcome Measure  Help needed turning from your back to your side while in a flat bed without using bedrails?: A Little Help needed moving from lying on your back to sitting on the side of a flat bed without using bedrails?: A Little Help needed moving to and from a bed to a chair (including a wheelchair)?: A Lot Help needed standing up from a chair using your arms (e.g., wheelchair or bedside chair)?: A Lot Help needed to walk in hospital room?: A Lot Help needed climbing 3-5 steps with a railing? : Total 6 Click Score: 13    End of Session Equipment Utilized During Treatment: Gait belt Activity Tolerance: Patient tolerated treatment well Patient left: with call bell/phone within reach;in chair;with chair alarm set Nurse Communication: Mobility status PT Visit Diagnosis: Unsteadiness on feet (R26.81);Repeated falls (R29.6);History of falling (Z91.81) Hemiplegia - Right/Left: Left Hemiplegia - dominant/non-dominant: Non-dominant Hemiplegia - caused by: Cerebral infarction     Time: 2263-3354 PT Time Calculation (min) (ACUTE ONLY): 22 min  Charges:  $Gait Training: 8-22 mins                     Kallie Locks, [place] Pager [phone] Acute Rehab   Sheral Apley 06/30/2019, 1:40 PM

## 2019-06-30 NOTE — TOC Progression Note (Signed)
Transition of Care [facility]) - Progression Note    Patient Details  Name: Jordan Sawyer MRN: [id number] Date of Birth: 04/05/1946  [facility]) CM/SW Contact  Adisa Vigeant, Adria Devon, RN Phone Number: 06/30/2019, 10:59 AM  Clinical Narrative:     Darel Hong at [facility] will review clinicals. Awaiting call back   Expected Discharge Plan: Skilled Nursing Facility Barriers to Discharge: Continued Medical Work up  Expected Discharge Plan and Services Expected Discharge Plan: Skilled Nursing Facility In-house Referral: Clinical Social Work Discharge Planning Services: CM Consult Post Acute Care Choice: Skilled Nursing Facility Living arrangements for the past 2 months: Single Family Home Expected Discharge Date: 06/28/19                                     Social Determinants of Health (SDOH) Interventions    Readmission Risk Interventions Readmission Risk Prevention Plan 12/18/2018  Post Dischage Appt Not Complete  Appt Comments Office closed on 7/3  Medication Screening Complete  Transportation Screening Complete  Some recent data might be hidden

## 2019-06-30 NOTE — TOC Progression Note (Signed)
Transition of Care [facility]) - Progression Note    Patient Details  Name: Jordan Sawyer MRN: [id number] Date of Birth: 02-29-1946  [facility] Care [facility]) CM/SW Contact  Doy Hutching, [place] Phone Number: 06/30/2019, 10:35 AM  Clinical Narrative:    Pt still with no current offers available.  [facility] and left message for Eual Fines to discuss placement availability at their SNF. Previous notes indicate this placement was not available this week however there may have been changes.    Expected Discharge Plan: Skilled Nursing Facility Barriers to Discharge: Continued Medical Work up  Expected Discharge Plan and Services Expected Discharge Plan: Skilled Nursing Facility In-house Referral: Clinical Social Work Discharge Planning Services: CM Consult Post Acute Care Choice: Skilled Nursing Facility Living arrangements for the past 2 months: Single Family Home Expected Discharge Date: 06/28/19                Readmission Risk Interventions Readmission Risk Prevention Plan 12/18/2018  Post Dischage Appt Not Complete  Appt Comments Office closed on 7/3  Medication Screening Complete  Transportation Screening Complete  Some recent data might be hidden

## 2019-06-30 NOTE — Progress Notes (Signed)
Spoke with dtr, Mimi and updated her on care plan.

## 2019-07-01 LAB — CBC
HCT: 39.4 % (ref 39.0–52.0)
Hemoglobin: 12.9 g/dL — ABNORMAL LOW (ref 13.0–17.0)
MCH: 29.4 pg (ref 26.0–34.0)
MCHC: 32.7 g/dL (ref 30.0–36.0)
MCV: 89.7 fL (ref 80.0–100.0)
Platelets: 241 10*3/uL (ref 150–400)
RBC: 4.39 MIL/uL (ref 4.22–5.81)
RDW: 13.5 % (ref 11.5–15.5)
WBC: 11.2 10*3/uL — ABNORMAL HIGH (ref 4.0–10.5)
nRBC: 0 % (ref 0.0–0.2)

## 2019-07-01 LAB — BASIC METABOLIC PANEL
Anion gap: 11 (ref 5–15)
BUN: 24 mg/dL — ABNORMAL HIGH (ref 8–23)
CO2: 21 mmol/L — ABNORMAL LOW (ref 22–32)
Calcium: 8.9 mg/dL (ref 8.9–10.3)
Chloride: 106 mmol/L (ref 98–111)
Creatinine, Ser: 1.17 mg/dL (ref 0.61–1.24)
GFR calc Af Amer: >60 mL/min (ref >60)
GFR calc non Af Amer: >60 mL/min (ref >60)
Glucose, Bld: 119 mg/dL — ABNORMAL HIGH (ref 70–99)
Potassium: 4 mmol/L (ref 3.5–5.1)
Sodium: 138 mmol/L (ref 135–145)

## 2019-07-01 LAB — GLUCOSE, CAPILLARY: Glucose-Capillary: 88 mg/dL (ref 70–99)

## 2019-07-01 NOTE — TOC Progression Note (Signed)
Transition of Care [facility]) - Progression Note    Patient Details  Name: Jordan Sawyer MRN: [id number] Date of Birth: [birth year]  Transition of Care [facility]) CM/SW Contact  Doy Hutching, [place] Phone Number: 07/01/2019, 4:03 PM  Clinical Narrative:    Still no bed offers for pt at this time. [facility] in [place] is following however no beds available at this time.   TOC team continues to seek placement.    Expected Discharge Plan: Skilled Nursing Facility Barriers to Discharge: Continued Medical Work up  Expected Discharge Plan and Services Expected Discharge Plan: Skilled Nursing Facility In-house Referral: Clinical Social Work Discharge Planning Services: CM Consult Post Acute Care Choice: Skilled Nursing Facility Living arrangements for the past 2 months: Single Family Home Expected Discharge Date: 06/28/19                  Readmission Risk Interventions Readmission Risk Prevention Plan 12/18/2018  Post Dischage Appt Not Complete  Appt Comments Office closed on 7/3  Medication Screening Complete  Transportation Screening Complete  Some recent data might be hidden

## 2019-07-01 NOTE — Progress Notes (Signed)
Pt very confused and agitated the entire night and pt has been trying to get out of bed multiple times. This RN  reeoriented pt multiple times and did everything to keep pt safe, floor mats are already in placed and patient currently on low bed. This RN paged MD if patient can have anything for agitation and then patient was seen by two MD on call last night and per MD they can't give anything for now. Will continue to monitor patient with remainder of shift.

## 2019-07-01 NOTE — Progress Notes (Signed)
Subjective:   O/N: Pt had confusion and agitation overnight but was redirectable.   Pt was examined at the bedside this AM. Pt was asleep but woke up to the sound of his name. States he is doing well. No complaints at this time.   Objective:  CBC Latest Ref Rng & Units 07/01/2019 06/29/2019 06/28/2019  WBC 4.0 - 10.5 K/uL 11.2(H) 5.2 5.7  Hemoglobin 13.0 - 17.0 g/dL 12.9(L) 12.7(L) 13.5  Hematocrit 39.0 - 52.0 % 39.4 39.3 40.9  Platelets 150 - 400 K/uL 241 272 281   BMP Latest Ref Rng & Units 07/01/2019 06/29/2019 06/28/2019  Glucose 70 - 99 mg/dL 295(J) 884(Z) 660(Y)  BUN 8 - 23 mg/dL 30(Z) 60(F) 17  Creatinine 0.61 - 1.24 mg/dL 0.93 2.35 5.73  Sodium 135 - 145 mmol/L 138 139 140  Potassium 3.5 - 5.1 mmol/L 4.0 3.8 4.5  Chloride 98 - 111 mmol/L 106 106 106  CO2 22 - 32 mmol/L 21(L) 23 27  Calcium 8.9 - 10.3 mg/dL 8.9 2.2(G) 9.4   Vital signs in last 24 hours: Vitals:   06/30/19 0432 06/30/19 1353 06/30/19 1959 07/01/19 0607  BP: 135/71 121/76 115/83 (!) 105/94  Pulse: 63 (!) 118 (!) 104 (!) 104  Resp: 20 16  18   Temp: 97.6 F (36.4 C) 98.6 F (37 C) 98.3 F (36.8 C) 97.7 F (36.5 C)  TempSrc:  Oral Oral Oral  SpO2: 96% 99% 93% 97%  Weight:      Height:       Physical Exam General: Lying in bed, sleepy, NAD HEENT: NCAT CV: RRR, normal S1-S2 no murmurs rubs or gallops appreciated PULM: Clear to auscultation bilaterally, no crackles or wheezes ABD: Soft and nontender NEURO: No focal deficits  Assessment/Plan:  Principal Problem:   Neurological movement disorder Active Problems:   Agitation   Essential hypertension   Tobacco abuse   Frequent falls   Closed traumatic minimally displaced fracture of one rib of left side   Aortic atherosclerosis (HCC)   Myoclonic jerking  In summary,Mr. Azer is a 74 year old male with past medical history significant for dementia, CVA in June 2020,COPD, HTN, GERD,hypothyroidism, tobacco use disorderpresents today after  sustaining multiple fallsover the last several months, morefrequently this past week prior to admission,upper extremity myoclonic jerking. The patient is medically stable and ready for discharge.   #Hx CVA #MultipleFalls #Myoclonic jerks: Myoclonic jerking has improved since adding Klonopin 0.25 mg twice daily. EEG did not demonstrate any evidence of seizures. MRI demonstrated chronic small vessel ischemic changes throughout the brain as well as old small vessel infarctions of the right thalamus and right posterior basal ganglia.  -Neuro signed off, appreciate recs             -Continue Klonopin 0.25 mg disintegrating tablets BID for myoclonic jerks. Patient will need to follow-up in outpatient setting -PT/OT/SLP consulted, recommend SNF. -Aspirin 81 mg daily -Stable to discharge to skilled nursing facility today  #Leukocytosis: WBC elevated to 11.2 from 5.2 the day prior. No fever or other sign of infection. Pt states he feels fine and mental status is at his baseline. Will continue to monitor. -CBC in AM  #Insomnia #Dementia #Aggitation: Pt had no agitation overnight. Will continue to monitor and titrate medications appropriately  -Zyprexa 5 mg nightly -Continue ramelteon 8mg  nightly  -Attempt redirection PRN  #L 11th rib 07[birth year]20[birth year]1971 by fall prior to this hospitalization.Confirmed by CT of the chest on admission. Pt states pain is well controlled. -Tylenol 650 mg  every 6 hours as needed  #COPD #Tobacco abuse -Nicotine patch 21 mg  #HTN: -Amlodipine 5 mg daily -Lisinopril 20 mg daily   #HLD -Lipitor 20 mg daily  #Hypothyroidism -Synthroid 100 mcg   #GERD -Protonix 40 mg   #DVT prophylaxis -Lovenox 40 mg subq injections daily  #CODE STATUS:FULL  #Dispo: Medically stable to discharge to skilled nursing facility today. Awaiting bed placement. Will continue to be in touch with CSW.  Earlene Plater, MD Internal Medicine, PGY1 Pager:  [phone]  07/01/2019,10:04 AM

## 2019-07-02 ENCOUNTER — Inpatient Hospital Stay (HOSPITAL_COMMUNITY): Payer: Medicare PPO

## 2019-07-02 DIAGNOSIS — Z7181 Spiritual or religious counseling: Secondary | ICD-10-CM

## 2019-07-02 DIAGNOSIS — R0902 Hypoxemia: Secondary | ICD-10-CM

## 2019-07-02 DIAGNOSIS — Z515 Encounter for palliative care: Secondary | ICD-10-CM

## 2019-07-02 DIAGNOSIS — D72829 Elevated white blood cell count, unspecified: Secondary | ICD-10-CM

## 2019-07-02 DIAGNOSIS — K117 Disturbances of salivary secretion: Secondary | ICD-10-CM

## 2019-07-02 DIAGNOSIS — R410 Disorientation, unspecified: Secondary | ICD-10-CM

## 2019-07-02 DIAGNOSIS — R4182 Altered mental status, unspecified: Secondary | ICD-10-CM

## 2019-07-02 DIAGNOSIS — Z7189 Other specified counseling: Secondary | ICD-10-CM

## 2019-07-02 DIAGNOSIS — M6281 Muscle weakness (generalized): Secondary | ICD-10-CM

## 2019-07-02 DIAGNOSIS — R0681 Apnea, not elsewhere classified: Secondary | ICD-10-CM

## 2019-07-02 DIAGNOSIS — K5900 Constipation, unspecified: Secondary | ICD-10-CM

## 2019-07-02 LAB — BASIC METABOLIC PANEL
Anion gap: 12 (ref 5–15)
BUN: 23 mg/dL (ref 8–23)
CO2: 20 mmol/L — ABNORMAL LOW (ref 22–32)
Calcium: 8.8 mg/dL — ABNORMAL LOW (ref 8.9–10.3)
Chloride: 106 mmol/L (ref 98–111)
Creatinine, Ser: 1.07 mg/dL (ref 0.61–1.24)
GFR calc Af Amer: >60 mL/min (ref >60)
GFR calc non Af Amer: >60 mL/min (ref >60)
Glucose, Bld: 114 mg/dL — ABNORMAL HIGH (ref 70–99)
Potassium: 4 mmol/L (ref 3.5–5.1)
Sodium: 138 mmol/L (ref 135–145)

## 2019-07-02 LAB — URINALYSIS, ROUTINE W REFLEX MICROSCOPIC
Bilirubin Urine: NEGATIVE
Glucose, UA: NEGATIVE mg/dL
Ketones, ur: NEGATIVE mg/dL
Nitrite: NEGATIVE
Protein, ur: 30 mg/dL — AB
Specific Gravity, Urine: 1.019 (ref 1.005–1.030)
pH: 6 (ref 5.0–8.0)

## 2019-07-02 LAB — BLOOD GAS, ARTERIAL
Acid-base deficit: 2 mmol/L (ref 0.0–2.0)
Bicarbonate: 21.8 mmol/L (ref 20.0–28.0)
Drawn by: 441661
FIO2: 21
O2 Saturation: 94.5 %
Patient temperature: 36.8
pCO2 arterial: 33.9 mmHg (ref 32.0–48.0)
pH, Arterial: 7.423 (ref 7.350–7.450)
pO2, Arterial: 72.2 mmHg — ABNORMAL LOW (ref 83.0–108.0)

## 2019-07-02 LAB — HEPATIC FUNCTION PANEL
ALT: 23 U/L (ref 0–44)
AST: 19 U/L (ref 15–41)
Albumin: 3 g/dL — ABNORMAL LOW (ref 3.5–5.0)
Alkaline Phosphatase: 122 U/L (ref 38–126)
Bilirubin, Direct: 0.1 mg/dL (ref 0.0–0.2)
Indirect Bilirubin: 0.5 mg/dL (ref 0.3–0.9)
Total Bilirubin: 0.6 mg/dL (ref 0.3–1.2)
Total Protein: 6.8 g/dL (ref 6.5–8.1)

## 2019-07-02 LAB — CBC
HCT: 38.9 % — ABNORMAL LOW (ref 39.0–52.0)
Hemoglobin: 13 g/dL (ref 13.0–17.0)
MCH: 30.1 pg (ref 26.0–34.0)
MCHC: 33.4 g/dL (ref 30.0–36.0)
MCV: 90 fL (ref 80.0–100.0)
Platelets: 245 10*3/uL (ref 150–400)
RBC: 4.32 MIL/uL (ref 4.22–5.81)
RDW: 13.7 % (ref 11.5–15.5)
WBC: 11 10*3/uL — ABNORMAL HIGH (ref 4.0–10.5)
nRBC: 0 % (ref 0.0–0.2)

## 2019-07-02 LAB — GLUCOSE, CAPILLARY
Glucose-Capillary: 108 mg/dL — ABNORMAL HIGH (ref 70–99)
Glucose-Capillary: 119 mg/dL — ABNORMAL HIGH (ref 70–99)

## 2019-07-02 MED ORDER — LEVOTHYROXINE SODIUM 100 MCG/5ML IV SOLN
50.0000 ug | Freq: Every day | INTRAVENOUS | Status: DC
  Administered 2019-07-02: 50 ug via INTRAVENOUS
  Filled 2019-07-02 (×3): qty 5

## 2019-07-02 MED ORDER — BISACODYL 10 MG RE SUPP: 10.0000 mg | Freq: Every day | RECTAL | Status: DC | PRN

## 2019-07-02 MED ORDER — NALOXONE HCL 0.4 MG/ML IJ SOLN
INTRAMUSCULAR | Status: AC
  Filled 2019-07-02: qty 1

## 2019-07-02 MED ORDER — LACTATED RINGERS IV SOLN: INTRAVENOUS | Status: DC

## 2019-07-02 MED ORDER — LACTATED RINGERS IV BOLUS
1000.0000 mL | Freq: Once | INTRAVENOUS | Status: AC
  Administered 2019-07-02: 1000 mL via INTRAVENOUS

## 2019-07-02 MED ORDER — PANTOPRAZOLE SODIUM 40 MG IV SOLR
40.0000 mg | Freq: Every day | INTRAVENOUS | Status: DC
  Administered 2019-07-02: 40 mg via INTRAVENOUS
  Filled 2019-07-02: qty 40

## 2019-07-02 MED ORDER — SODIUM CHLORIDE 0.9 % IV SOLN
2.0000 g | Freq: Three times a day (TID) | INTRAVENOUS | Status: DC
  Administered 2019-07-02 – 2019-07-03 (×4): 2 g via INTRAVENOUS
  Filled 2019-07-02 (×6): qty 2

## 2019-07-02 MED ORDER — POLYETHYLENE GLYCOL 3350 17 G PO PACK
17.0000 g | PACK | Freq: Every day | ORAL | Status: DC
  Administered 2019-07-02 – 2019-07-07 (×5): 17 g via ORAL
  Filled 2019-07-02 (×5): qty 1

## 2019-07-02 MED ORDER — VANCOMYCIN HCL 1500 MG/300ML IV SOLN
1500.0000 mg | INTRAVENOUS | Status: DC
  Filled 2019-07-02: qty 300

## 2019-07-02 NOTE — Progress Notes (Signed)
ABG collected and sent to the lab 

## 2019-07-02 NOTE — Progress Notes (Signed)
Pharmacy Antibiotic Note  Jordan Sawyer is a 74 y.o. male admitted on 06/19/2019 with indolent infection.  Pharmacy has been consulted for Vanc and Cefepime dosing.  Vancomycin 1500 mg IV Q 24 hrs. Goal AUC 400-550. Expected AUC: 481 SCr used: 1.07  Plan: Start Cefepime 2 grams IV q8hr Start Vancomycin 1500 mg IV q24hr Monitor renal function, C&S, clinical status and vanc levels as needed  Height: 5\' 10"  (177.8 cm) Weight: 171 lb 15.3 oz (78 kg) IBW/kg (Calculated) : 73  Temp (24hrs), Avg:98.1 F (36.7 C), Min:97.4 F (36.3 C), Max:98.4 F (36.9 C)  Recent Labs  Lab 06/26/19 0450 06/28/19 0315 06/29/19 0315 07/01/19 0337 07/02/19 0350 07/02/19 0737  WBC 5.4 5.7 5.2 11.2* 11.0*  --   CREATININE 1.23 1.15 1.19 1.17  --  1.07    Estimated Creatinine Clearance: 61.6 mL/min (by C-G formula based on SCr of 1.07 mg/dL).    No Known Allergies  Antimicrobials this admission: Cefepime 1/15 >>  Vanc 1/15 >>   Thank you for allowing pharmacy to be a part of this patient's care.  2/15, PharmD, [facility] Clinical Pharmacist Please see AMION for all Pharmacists' Contact Phone Numbers 07/02/2019, 2:52 PM

## 2019-07-02 NOTE — Progress Notes (Addendum)
Paged by RN/rapid response that patient minimally responsive. Patient evaluated at bedside. Patient sleeping, arousable only to sternal rub. On arousal, patient briefly agitated, moving all extremities but quickly falls back asleep. During sleep, patient has apneic episodes, desaturated to 84% during episode. CBG WNL. Medications reviewed, no narcotic medications evident. Trialed with narcan x1 without response.   Physical Exam: General Difficult to arouse, sleeping  Cardiac Regular rate and rhythm, no murmurs, rubs, or gallops  Pulmonary Clear to auscultation bilaterally without wheezes, rhonchi, or rales Brief apneic episodes while sleeping  Neuro Difficult to arouse, moves extremities to sternal rub Left eye with mild deviation   Plan:  * Ordered ABG, CXR, Blood culturex2, urinalysis, urine culture * Transfer to progressive care with telemetry  Katherine Roan, MD 07/02/2019, 7:31 AM

## 2019-07-02 NOTE — Significant Event (Signed)
Rapid Response Event Note  Overview: Called because pt difficult to arouse. VSS per RN. Asked RN to get CBG.  Time Called: 0621 Arrival Time: 0627 Event Type: Neurologic  Initial Focused Assessment: Pt laying in bed with eyes closed. Pt will only respond to deep sternal rub. Pt will move all extremities to command once awake but will go back to sleep very quickly. Pt is having periods of apnea. Pupils 2 and slugglish. Lungs clear with upper airway snore. Pt has weak gag reflex. T-98.2, HR-98, BP-142/76, RR-18, SpO2-94 on RA, CBG-108.  Pt received 0.25mg  klonopin, 50m zyprexa, and 8mg  rozerem at 2148.  Interventions: ABG-7.42/33.9/72.2/21.8 IMTS to bedside.  Narcan given x 1 per MD BMP, EKG, PCXR U/A, CX BC X 2 Transfer to PCU  Report given to [facility], day shift RRT  Event Summary: Name of Physician Notified: [facility]. [facility] at [phone]    at          [facility]

## 2019-07-02 NOTE — Progress Notes (Addendum)
Subjective:   O/N: per Dr. Benjamine Sawyer, Rapid Response was called and the patient was only arousable only to sternal rub. On arousal, patient briefly agitated, moving all extremities but quickly falls back asleep. During sleep, patient has apneic episodes, desaturated to 84%  Pt was examined at the bedside this AM. He is still somewhat difficult to arouse. He mumbles in response to our questions. He withdraws all four extremities equally to pain. He mumbles "yeah" when asked if he is feeling OK. He is trying to take his gown off. When asked if he needs anything from [place] today he says, "no."  Objective:  CBC Latest Ref Rng & Units 07/02/2019 07/01/2019 06/29/2019  WBC 4.0 - 10.5 K/uL 11.0(H) 11.2(H) 5.2  Hemoglobin 13.0 - 17.0 g/dL 13.0 12.9(L) 12.7(L)  Hematocrit 39.0 - 52.0 % 38.9(L) 39.4 39.3  Platelets 150 - 400 K/uL 245 241 272   BMP Latest Ref Rng & Units 07/01/2019 06/29/2019 06/28/2019  Glucose 70 - 99 mg/dL 119(H) 102(H) 106(H)  BUN 8 - 23 mg/dL 24(H) 25(H) 17  Creatinine 0.61 - 1.24 mg/dL 1.17 1.19 1.15  Sodium 135 - 145 mmol/L 138 139 140  Potassium 3.5 - 5.1 mmol/L 4.0 3.8 4.5  Chloride 98 - 111 mmol/L 106 106 106  CO2 22 - 32 mmol/L 21(L) 23 27  Calcium 8.9 - 10.3 mg/dL 8.9 8.8(L) 9.4   Vital signs in last 24 hours: Vitals:   07/01/19 2103 07/02/19 0437 07/02/19 0601 07/02/19 0623  BP: 110/66 98/86 101/65 (!) 142/76  Pulse: (!) 105 99 (!) 101 98  Resp: 20 16 18 18   Temp: 98 F (36.7 C) (!) 97.4 F (36.3 C) 98.2 F (36.8 C) 98.2 F (36.8 C)  TempSrc: Oral Oral Oral Oral  SpO2: 99% 97% 97% 94%  Weight:      Height:       Physical Exam General: Lying in bed, sleepy, NAD HEENT: NCAT CV: RRR, normal S1-S2 no murmurs rubs or gallops appreciated PULM: Clear to auscultation bilaterally, no crackles or wheezes ABD: Soft and nontender NEURO: No focal deficits  Assessment/Plan:  Principal Problem:   Neurological movement disorder Active Problems:   Agitation  Essential hypertension   Tobacco abuse   Frequent falls   Closed traumatic minimally displaced fracture of one rib of left side   Aortic atherosclerosis (HCC)   Myoclonic jerking  In summary,Jordan Sawyer is a 74 year old male with past medical history significant for dementia, 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  His course was complicated by a significant event that happened overnight, where the patient was having apneic spells with desaturations to the 84%, as well as somnolence and difficulty to arouse.  #New AMS #Leukocytosis #Hypoxia secondary to apneic spells:  Patient's neurologic exam is reassuring, noting that there are no focal deficits and withdraws all extremities to sternal rub.  Patient is able to state that he feels tired and appears somnolent. This may be secondary to medication side effect. ABG notable for low PO2 to 72.2, however the patient saturations have been in the upper 90s since the rapid response called this morning.  CXR did not show evidence of pulmonary infiltrates.  Lungs sound clear on physical exam. WBC elevated to 11.0 today. Yesterday was 11.2 from 5.2 the day prior which could suggest indolent infection. -f/u urinalysis and urine cultures -f/u blood cultures -Transfer to progressive  care with telemetry -Was able to speak to the daughter today, she is interested in having a palliative care consult ordered to help with goals of care discussion.  The family is thinking about taking him home and have home health/palliative support since we are having issues with SNF placement. Orders have been placed.  -Pt may need sleep study once discharged for apneic episodies  #Hx CVA #MultipleFalls #Myoclonic jerks: Myoclonic jerking has improved since adding Klonopin 0.25 mg  twice daily. EEG did not demonstrate any evidence of seizures. MRI demonstrated chronic small vessel ischemic changes throughout the brain as well as old small vessel infarctions of the right thalamus and right posterior basal ganglia. -Neuro signed off, appreciate recs - D/c klonopin 0.25 mg BID due to somnolence. If myoclonic jerkin returns, will restart. -PT/OT/SLP consulted, recommend SNF.  -Aspirin 81 mg daily  #Leukocytosis: . No fever or other sign of infection, but since the pt has new AMS this could suggest an indolent infection. CXR did not demonstrate any pulmonary infiltrates that would suggest pneumonia  -UA, urine and blood cultures ordered  #Insomnia #Dementia #Aggitation: Pt had no agitation overnight.  But has somnolent episode as noted above. -d/c Zyprexa 5 mg nightly due to somnolence -d/c ramelteon 8mg  nightly due to somnolence -Attempt redirection PRN  #L 11th rib by fall prior to this hospitalization.Confirmed by CT of the chest on admission. Pt states pain is well controlled. -Tylenol 650 mg every 6 hours as needed  #COPD #Tobacco abuse -Nicotine patch 21 mg  #HTN: -Amlodipine 5 mg daily -Lisinopril 20 mg daily   #HLD -Lipitor 20 mg daily  #Hypothyroidism -Synthroid 100 mcg   #GERD -Protonix 40 mg   #DVT prophylaxis -Lovenox 40 mg subq injections daily  #CODE STATUS:FULL  #Dispo: Pending medical course.  YB:WLSLHT, MD Internal Medicine, PGY1 Pager: [phone]  07/02/2019,11:29 AM

## 2019-07-02 NOTE — Consult Note (Signed)
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   Per chart review he was admitted to the hospital from December 15, 2018 to January 14, 2019 for an acute lacunar stroke involving the right brain.  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  The initial plan was for him to be discharged to inpatient rehab however, given the improved progression of his symptoms, he was discharged home with home health.  Since that time, the the patient's had functional decline, most drastically within the past week.  He has had increased number of falls, involuntary jerking, and not able to ambulate without assistance.  On 06/18/2019, patient was taken to [facility] where he was diagnosed with ambulatory dysfunction, multiple contusions and rib fractures.  The patient has also been choking, and coughing when he eats over the past 2 days.  Yesterday, he only drink 1 can of Ensure.  At this point, the patient's caretakers, his daughter and wife, are overwhelmed and do not feel they can care for him in his current condition  Clinical Assessment and Goals of Care: Chart reviewed. Spoke to patients bedside RN who said patient was now awake. Patients daughter, Mimi present at bedside. Patient alert and eating dinner at the  time of assessment. He denied nausea, shortness of breath, or pain. Appears in good spirits.    Mimi and I stepped outside of the room per her request. Asked Mimi to give me an impression of the type of man Jordan Sawyer is. Mimi shared that he is her "hero". He loves hunting and fishing. 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 He has w wife with whom he had two daughters. Enjoys [place], [employer], and [employer]   Explained Palliative Care concepts to Mimi. She said that this morning she was very "scared" that Mr. Glance was not doing well. Mimi felt he was getting closer to end of life. Per our evening assessment he appears to be doing much better. We talked about delirium and how this could likely be contributing to his complicated clinical picture. Shared the plateau idea of delirium, we discussed that he may get to a new baseline decreased level of function. Talked about how some delirium types are hyperactive (pulling lines, getting out of bed) and some are hypoactive (somnolent). It seems like Jordan Sawyer is oscillating between the two whereby he  is pulling his gown off at times and sleepy at other times. Explained the ongoing complications of delirium.   We discussed the differences between Palliative care and Hospice. Explained that Palliative care following him upon discharge does appear to be a good idea. Asked what hopes are for patient Sawyer's recovery. Per Mimi she and her family would like for him to go to a rehabilitation facility [facility]) and get strong enough to get back home. I asked whether or not she felt he needed a longer term living situation which she was not quite sure of. Much is contingent upon his ability to rehabilitate. We discussed what his care needs may look like at home, she had though hospice would offer 24/7 care. Clarified that unfortunately, that is not the type of care hospice  provides.   Discussed patients code status and concerns that placing him a situation whereby we perform resuscitation would likely result in trauma to the patient in addition to a decreased quality of life.   We reviewed the MOST form which I recommended Mimi discussed with her family.  We discussed tube feeding though Mimi feels biased as she had tube feedings while she was in a coma which she attributes to saving her life.   Provided "Hard Choices for [employer]" booklet.  This visit was focused on rapport building.   Told patient and family I would be back tomorrow to check in on him.   Decision Maker: [name] (wife) [phone] [name] (Daughter) [phone]  SUMMARY OF RECOMMENDATIONS   Remains full code  Spiritual consult OP Palliative consult ordered  Code Status/Advance Care Planning:  Full code  Symptom Management:  Goals of Care:  - As above, patients family will discuss MOST form  - Plan to check in tomorrow to identify if patient and family have gotten to discuss his wishes  Delirium:  - Delirium precautions  - Get up during the day  - Encourage a familiar face to remain present throughout the day  - Keep blinds open and lights on during daylight hours  - Minimize the use of opioids/benzodiazepines   Constipation:  - Miralax 17g PO QDay  Muscular Weakness:  - PT  - OT  Xerostomia:  - Good oral care QShift  - Encourage liquid intake  Spiritual:  - Chaplain consult  Dispo:  - Hope to transition to SNF  - OP Palliative follow up  Palliative Prophylaxis:   Aspiration, Bowel Regimen, Delirium Protocol, Eye Care, Oral Care, Palliative Wound Care and Turn Reposition  Additional Recommendations (Limitations, Scope, Preferences):  Full Scope Treatment  Psycho-social/Spiritual:   Desire for further Chaplaincy support: es  Additional Recommendations: Caregiving  Support/Resources  Prognosis:   Unable to determine, had had  a complicated delirium  Discharge Planning: Skilled Nursing Facility for rehab with Palliative care service follow-up     Primary Diagnoses: Present on Admission: . Agitation . Tobacco abuse . Neurological movement disorder . Closed traumatic minimally displaced fracture of one rib of left side . Aortic atherosclerosis (HCC) . Essential hypertension  I have reviewed the medical record, interviewed the patient and family, and examined the patient. The following aspects are pertinent.  Past Medical History:  Diagnosis Date  . Anxiety   . COPD (chronic obstructive pulmonary disease) (HCC)   . Dementia (HCC)   . Hypertension   . Hypothyroidism   . Kidney stones   . Migraine   . Stroke (HCC) 12/15/2018  . Tobacco abuse    Social History  Socioeconomic History  . Marital status: Married    Spouse name: Not on file  . Number of children: Not on file  . Years of education: Not on file  . Highest education level: Not on file  Occupational History  . Not on file  Tobacco Use  . Smoking status: Current Every Day Smoker    Packs/day: 3.00  . Smokeless tobacco: Never Used  Substance and Sexual Activity  . Alcohol use: Not Currently  . Drug use: Not Currently  . Sexual activity: Not on file  Other Topics Concern  . Not on file  Social History Narrative  . Not on file   Social Determinants of Health   Financial Resource Strain:   . Difficulty of Paying Living Expenses: Not on file  Food Insecurity:   . Worried About Programme researcher, broadcasting/film/video in the Last Year: Not on file  . Ran Out of Food in the Last Year: Not on file  Transportation Needs:   . Lack of Transportation (Medical): Not on file  . Lack of Transportation (Non-Medical): Not on file  Physical Activity:   . Days of Exercise per Week: Not on file  . Minutes of Exercise per Session: Not on file  Stress:   . Feeling of Stress : Not on file  Social Connections:   . Frequency of Communication with Friends and  Family: Not on file  . Frequency of Social Gatherings with Friends and Family: Not on file  . Attends Religious Services: Not on file  . Active Member of Clubs or Organizations: Not on file  . Attends Banker Meetings: Not on file  . Marital Status: Not on file   Family History  Problem Relation Age of Onset  . Hypertension Mother   . Hypertension Father    Scheduled Meds: . aspirin EC  81 mg Oral Daily  . atorvastatin  20 mg Oral Daily  . enoxaparin (LOVENOX) injection  40 mg Subcutaneous Q24H  . lamoTRIgine  200 mg Oral Daily  . levothyroxine  50 mcg Intravenous Daily  . mouth rinse  15 mL Mouth Rinse BID  . multivitamin with minerals  1 tablet Oral Daily  . naloxone      . nicotine  21 mg Transdermal Daily  . pantoprazole (PROTONIX) IV  40 mg Intravenous QHS  . sodium chloride flush  3 mL Intravenous Once  . sodium chloride flush  3 mL Intravenous Q12H   Continuous Infusions: . ceFEPime (MAXIPIME) IV 200 mL/hr at 07/02/19 1623  . lactated ringers Stopped (07/02/19 1634)   PRN Meds:.acetaminophen Medications Prior to Admission:  Prior to Admission medications   Medication Sig Start Date End Date Taking? Authorizing Provider  aspirin EC 81 MG tablet Take 1 tablet (81 mg total) by mouth daily. 01/14/19 01/14/20 Yes Micki Riley, MD  atorvastatin (LIPITOR) 20 MG tablet Take 1 tablet (20 mg total) by mouth daily. 01/14/19 01/14/20 Yes Micki Riley, MD  clindamycin (CLEOCIN) 150 MG capsule Take 300 mg by mouth every 6 (six) hours. 06/14/19  Yes [provider]  cyanocobalamin (,VITAMIN B-12,) 1000 MCG/ML injection Inject 1,000 mcg into the muscle every 30 (thirty) days. 06/15/19  Yes [provider]  HYDROcodone-acetaminophen (NORCO) 10-325 MG tablet Take 1 tablet by mouth every 6 (six) hours as needed for moderate pain.  01/13/19  Yes [provider]  lamoTRIgine (LAMICTAL) 200 MG tablet Take 200 mg by mouth daily.   Yes [provider]  levothyroxine (  SYNTHROID) 100 MCG tablet Take 100 mcg by mouth daily before breakfast.  12/31/18  Yes [provider]  lisinopril (ZESTRIL) 20 MG tablet Take 1 tablet (20 mg total) by mouth 2 (two) times a day. 12/18/18  Yes Layne BentonBiby, Sharon L, NP  nicotine (NICODERM CQ - DOSED IN MG/24 HOURS) 21 mg/24hr patch Place 1 patch (21 mg total) onto the skin daily. 12/19/18  Yes Layne BentonBiby, Sharon L, NP  oxybutynin (DITROPAN) 5 MG tablet Take 5 mg by mouth 2 (two) times daily. 05/31/19  Yes [provider]  pantoprazole (PROTONIX) 40 MG tablet Take 40 mg by mouth daily.   Yes [provider]  tamsulosin (FLOMAX) 0.4 MG CAPS capsule Take 0.4 mg by mouth daily. 05/28/19  Yes [provider]  venlafaxine XR (EFFEXOR-XR) 150 MG 24 hr capsule Take 150 mg by mouth daily with breakfast.   Yes [provider]  clonazePAM (KLONOPIN) 0.25 MG disintegrating tablet Take 1 tablet (0.25 mg total) by mouth 2 (two) times daily. 06/25/19   Kirt BoysAlexander, Andrew, MD  OLANZapine (ZYPREXA) 5 MG tablet Take 1 tablet (5 mg total) by mouth at bedtime. 06/28/19   Agyei, Hermina Staggersbed K, MD  ramelteon (ROZEREM) 8 MG tablet Take 1 tablet (8 mg total) by mouth at bedtime. 06/25/19   Kirt BoysAlexander, Andrew, MD   No Known Allergies Review of Systems  Constitutional: Positive for activity change and appetite change.  HENT: Negative.   Eyes: Negative.   Respiratory: Positive for cough.   Cardiovascular: Negative.   Gastrointestinal: Positive for constipation.  Endocrine: Negative.   Genitourinary: Negative.   Musculoskeletal: Negative.   Skin: Negative.   Allergic/Immunologic: Negative.   Neurological: Negative.   Hematological: Negative.   Psychiatric/Behavioral: Negative.    Physical Exam Vitals and nursing note reviewed.  HENT:     Head: Normocephalic.     Nose: Nose normal.     Mouth/Throat:     Mouth: Mucous membranes are dry.  Eyes:     Pupils: Pupils are equal, round, and reactive to  light.  Cardiovascular:     Rate and Rhythm: Normal rate and regular rhythm.     Pulses: Normal pulses.  Pulmonary:     Effort: Pulmonary effort is normal.     Comments: On 2LPM Justice Abdominal:     General: Bowel sounds are normal.  Musculoskeletal:     Cervical back: Normal range of motion.  Skin:    General: Skin is dry.     Capillary Refill: Capillary refill takes less than 2 seconds.  Neurological:     General: No focal deficit present.     Mental Status: He is alert.   Vital Signs: BP 100/72 (BP Location: Right Arm)   Pulse 78   Temp (!) 97.4 F (36.3 C) (Oral)   Resp 19   Ht 5\' 10"  (1.778 m)   Wt 78 kg   SpO2 97%   BMI 24.67 kg/m  Pain Scale: 0-10 POSS *See Group Information*: S-Acceptable,Sleep, easy to arouse Pain Score: 0-No pain  SpO2: SpO2: 97 % O2 Device:SpO2: 97 % O2 Flow Rate: .O2 Flow Rate (L/min): 2 L/min  IO: Intake/output summary:   Intake/Output Summary (Last 24 hours) at 07/02/2019 1745 Last data filed at 07/02/2019 1650 Gross per 24 hour  Intake 1242.07 ml  Output 450 ml  Net 792.07 ml   LBM: Last BM Date: 07/01/19 Baseline Weight: Weight: 80.5 kg Most recent weight: Weight: 78 kg     Palliative Assessment/Data: 50%  Time In: 1700 Time Out: 1815 Time Total: 75 Greater than 50%  of this time was spent counseling and coordinating care related to the above assessment and plan.  Signed by: Rosezella Rumpf, NP   Please contact Palliative Medicine Team phone at [phone] for questions and concerns.  For individual provider: See Shea Evans

## 2019-07-02 NOTE — Progress Notes (Signed)
This RN paged MD and Rapid Response because patient is difficult to arouse. VSS taken as follows: Temp: 98.2, BP: 101/65 (75), HR: 101, SP02: 97 on RA. Patient is having periods of apnea and with deep sternal rub, patient moves all extremities but quickly goes back to sleep. Last interaction with patient alert oriented was around 0450 AM while giving bath to patient. Blood glucose taken was 108. No narcotic medications given within shift. Stayed with the patient until the rapid response team and MDs arrived. Report given to day shift RN, Delia Heady.

## 2019-07-03 LAB — CBC
HCT: 36 % — ABNORMAL LOW (ref 39.0–52.0)
Hemoglobin: 11.7 g/dL — ABNORMAL LOW (ref 13.0–17.0)
MCH: 30.1 pg (ref 26.0–34.0)
MCHC: 32.5 g/dL (ref 30.0–36.0)
MCV: 92.5 fL (ref 80.0–100.0)
Platelets: 228 10*3/uL (ref 150–400)
RBC: 3.89 MIL/uL — ABNORMAL LOW (ref 4.22–5.81)
RDW: 13.8 % (ref 11.5–15.5)
WBC: 6.4 10*3/uL (ref 4.0–10.5)
nRBC: 0 % (ref 0.0–0.2)

## 2019-07-03 LAB — BASIC METABOLIC PANEL
Anion gap: 8 (ref 5–15)
BUN: 22 mg/dL (ref 8–23)
CO2: 22 mmol/L (ref 22–32)
Calcium: 8.6 mg/dL — ABNORMAL LOW (ref 8.9–10.3)
Chloride: 107 mmol/L (ref 98–111)
Creatinine, Ser: 1.18 mg/dL (ref 0.61–1.24)
GFR calc Af Amer: >60 mL/min (ref >60)
GFR calc non Af Amer: >60 mL/min (ref >60)
Glucose, Bld: 136 mg/dL — ABNORMAL HIGH (ref 70–99)
Potassium: 3.8 mmol/L (ref 3.5–5.1)
Sodium: 137 mmol/L (ref 135–145)

## 2019-07-03 MED ORDER — DICLOFENAC SODIUM 1 % EX GEL
4.0000 g | Freq: Three times a day (TID) | CUTANEOUS | Status: DC
  Administered 2019-07-03 – 2019-07-07 (×12): 4 g via TOPICAL
  Filled 2019-07-03: qty 100

## 2019-07-03 MED ORDER — ACETAMINOPHEN 500 MG PO TABS
500.0000 mg | ORAL_TABLET | Freq: Three times a day (TID) | ORAL | Status: DC
  Administered 2019-07-03 – 2019-07-07 (×11): 500 mg via ORAL
  Filled 2019-07-03 (×10): qty 1

## 2019-07-03 MED ORDER — SODIUM CHLORIDE 0.9 % IV SOLN: 2.0000 g | INTRAVENOUS | Status: DC

## 2019-07-03 MED ORDER — LEVOTHYROXINE SODIUM 100 MCG PO TABS
100.0000 ug | ORAL_TABLET | Freq: Every day | ORAL | Status: DC
  Administered 2019-07-03 – 2019-07-07 (×5): 100 ug via ORAL
  Filled 2019-07-03 (×5): qty 1

## 2019-07-03 MED ORDER — PANTOPRAZOLE SODIUM 40 MG PO TBEC
40.0000 mg | DELAYED_RELEASE_TABLET | Freq: Every day | ORAL | Status: DC
  Administered 2019-07-03 – 2019-07-06 (×4): 40 mg via ORAL
  Filled 2019-07-03 (×4): qty 1

## 2019-07-03 MED ORDER — SODIUM CHLORIDE 0.9 % IV SOLN
2.0000 g | Freq: Two times a day (BID) | INTRAVENOUS | Status: DC
  Administered 2019-07-04: 2 g via INTRAVENOUS
  Filled 2019-07-03 (×3): qty 2

## 2019-07-03 NOTE — Progress Notes (Signed)
Palliative Medicine Inpatient Follow Up Note   HPI: Per Hospitalist H&P --> 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   Today's Discussion (07/03/2019): Reviewed chart. Appears patient remains to have overnight agitation episodes. Had the opportunity in the afternoon when patient was more lucid to discuss code status. Per our discussion he would want CPR I asked if he understood what CPR was. 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 He said that both cases survived and thanked him regularly. We talked about the pressing on the chest and how hard and rapid that would be to be done properly, possibly reducing in trauma. [name] vocalized that if he's going to die he will die wither way. We talked about intubation which he said he would want a trial of, but if there is no foreseeable recovery he would want his family to decide to stop it. We then discussed artificial means of nutrition which he said adamantly, no to. I told him that our goal is always to respect his wishes within reason. We talked about discussing this with his other family members moving forward which he is agreeable to.   Attempted to call Jordan Sawyer to discuss code status with her further however she sis not pick up her phone. Arranging outpatient Palliative follow up.   Discussed with patient the importance of continued conversation with family and their  medical providers regarding overall plan of care and treatment options, ensuring decisions are within the context of the patients values and GOCs.  Questions and concerns addressed   Subjective Assessment: Patient endorses needing help putting his gown on. He denies nausea or shortness of breath.  Patient does have pain in ribs   Vital  Signs Vitals:   07/03/19 0802 07/03/19 1203  BP:  (!) 148/95  Pulse: 100 94  Resp:    Temp:  98.1 F (36.7 C)  SpO2: 98% 97%    Intake/Output Summary (Last 24 hours) at 07/03/2019 1333 Last data filed at 07/03/2019 0601 Gross per 24 hour  Intake 1750.75 ml  Output 500 ml  Net 1250.75 ml   Last Weight  Most recent update: 06/25/2019  6:25 AM   Weight  78 kg (171 lb 15.3 oz)           Gen:  Elderly m in NAD HEENT: dry mucous membranes CV: Regular rate and rhythm, no murmurs rubs or gallops PULM: clear to auscultation bilaterally ABD: soft/nontender EXT: No edema Neuro: Alert and oriented x2  Recommendations and Plan: Goals of Care:  - Continue to assist conversations facilities with family  - OP Palliative care consult  Pain, L rib:  - Tylenol 500mg  PO TID  - Voltaren 4g TID  - Heat PRN  Delirium, likely driven by UTI:                 - Delirium precautions                 - Encourage a familiar face to remain present throughout the day                 - Keep blinds open and lights on during daylight hours    - Get up during the day                 -  Minimize the use of opioids/benzodiazepines  - If not improving could trial Seroquel 25mg  PO at 1700 daily to aid in restoring circadian rythm                  Constipation:                 - Miralax 17g PO QDay  Muscular Weakness:                 - PT                 - OT  Xerostomia:                 - Good oral care QShift                 - Encourage liquid intake  Spiritual:                 - Chaplain consult  Dispo:                 - Hope to transition to SNF                 - OP Palliative follow up  Time Spent: 25 Greater than 50% of the time was spent in counseling and coordination of care ______________________________________________________________________________________ [facility]iative Medicine Team Team Cell Phone: [phone] Please utilize secure chat with  additional questions, if there is no response within 30 minutes please call the above phone number  Palliative Medicine Team providers are available by phone from 7am to 7pm daily and can be reached through the team cell phone.  Should this patient require assistance outside of these hours, please call the patient's attending physician.

## 2019-07-03 NOTE — Progress Notes (Addendum)
Chaplain responded to spiritual care consult, re: palliative care.  Pt is jovial and conversational, welcomed chaplain's visit. [employer] faith, very connected to church.  He reports he did not sleep well and expresses frustration that no one tells him what is going on. He is anxious to "get out of here".  During the visit, pt received two phone calls from [place], one of his grandsons, who describes himself as "like a son" to the pt. Chaplain provided spiritual, prayer and emotional support. Pt also prayed. Pt requests that Chaplain contact pastor [facility]) with request that pastor call pt. Please contact if further [place] is requested or needed.   Belia Heman [phone]  [phone] ~ Chaplain made several attempts to leave a message at church without success.    07/03/19 1000  Clinical Encounter Type  Visited With Patient;Family  Visit Type Initial;Spiritual support  Referral From Palliative care team  Consult/Referral To Chaplain  Spiritual Encounters  Spiritual Needs Prayer;Emotional  Stress Factors  Patient Stress Factors Lack of knowledge;Health changes

## 2019-07-03 NOTE — Progress Notes (Signed)
Subjective: HD#14   Overnight: Agitated, tried pulling IV lines however pleasant when night team evaluated him at bedside, mittens placed.   Today, Dierdre Highman had no recollection of him being agitated overnight.  He states that he is doing okay" slept fine.  "He states that he wants to go home and see his 3 grandsons and 2 granddaughters.  I explained to him that we are currently waiting for bed placement at rehab facility and he endorsed understanding.  He continues to report of rib pain from his recent fall.  Objective:  Vital signs in last 24 hours: Vitals:   07/02/19 1641 07/02/19 2008 07/02/19 2319 07/03/19 0423  BP: 100/72 126/78 118/65 122/85  Pulse: 78 86 (!) 103 88  Resp:  19 19 18   Temp: (!) 97.4 F (36.3 C) 98.3 F (36.8 C) 98 F (36.7 C) 98.6 F (37 C)  TempSrc: Oral   Oral  SpO2: 97% 100% 100% 98%  Weight:      Height:       Const: Alert and oriented x2, in no apparent distress, lying comfortably in bed, conversational HEENT: Atraumatic, normocephalic CV: RRR, no murmurs, gallop, rub  Assessment/Plan:  Principal Problem:   Neurological movement disorder Active Problems:   Agitation   Essential hypertension   Tobacco abuse   Frequent falls   Closed traumatic minimally displaced fracture of one rib of left side   Aortic atherosclerosis (HCC)   Myoclonic jerking   Palliative care by specialist   Goals of care, counseling/discussion   Delirium   Constipation   Muscular weakness   Xerostomia   Spiritual or religious counseling  In summary,Mr. 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 On July 02, 2019, he was found to have apneic spells with desaturations to the 84%, as well  as somnolence and difficulty to arouse.  #AoC Encephalopathy suspecting 2/2 UTI vs worsening delirium: Per nursing staff, he was agitated overnight and began pulling of IV lines, taken off his close.  Per evaluation by the overnight team, he was quite pleasant during their assessment. Blood Cx NGTD, pending urine culture.  He was also noted to have leukocytosis which has resolved since starting IV antibiotics.  -f/u blood cultures, urine culture -Continue cefepime -Pt may need sleep study once discharged for apneic episodies   #Hx CVA #MultipleFalls #Myoclonic jerks: Myoclonic jerking has improved since adding Klonopin 0.25 mg twice daily. EEG did not demonstrate any evidence of seizures. MRI demonstrated chronic small vessel ischemic changes throughout the brain as well as old small vessel infarctions of the right thalamus and right posterior basal ganglia. -Neuro signed off, appreciate recs -Continue to hold off klonopin 0.25 mg BID. If myoclonic jerkin returns, will restart. -PT/OT/SLP consulted, recommend SNF.  -Aspirin 81 mg daily   #Insomnia #Dementia -HOLD Zyprexa 5 mg nightly -HOLD ramelteon 8mg  nightly  -Attempt redirection PRN   #L 11th rib DG:LOVFIE by fall prior to this hospitalization.Confirmed by CT of the chest on admission. Pt states pain is well controlled. -Tylenol 650 mg every 6 hours as needed   #COPD #Tobacco abuse -Nicotine patch 21 mg   #HTN: -Amlodipine 5 mg daily -Lisinopril 20 mg daily    #HLD -Lipitor 20 mg daily   #Hypothyroidism -Synthroid 100 mcg    #GERD -Protonix 40 mg    #  DVT prophylaxis -Lovenox 40 mg subq injections daily  #CODE STATUS: Family desires full scope of care with hope to transition to SNF and outpatient palliative follow-up  #Dispo: Pending medical course.  Yvette Rack, MD 07/03/2019, 6:37 AM Pager: [phone] Internal Medicine Teaching Service

## 2019-07-03 NOTE — Progress Notes (Signed)
Pharmacy Antibiotic Note  Jordan Sawyer is a 74 y.o. male admitted on 06/19/2019 with AMS being treated for UTI.  Pharmacy has been consulted for  cefepime dosing.  Will decrease dose per guideline for UTI treatment (originally dosed for unclear infection. Urine culture growing GNR.   Plan: Decrease to Cefepime 2 grams IV q12hr Monitor renal function, C&S, LOT  Height: 5\' 10"  (177.8 cm) Weight: 171 lb 15.3 oz (78 kg) IBW/kg (Calculated) : 73  Temp (24hrs), Avg:98.1 F (36.7 C), Min:97.4 F (36.3 C), Max:98.6 F (37 C)  Recent Labs  Lab 06/28/19 0315 06/29/19 0315 07/01/19 0337 07/02/19 0350 07/02/19 0737 07/03/19 0319  WBC 5.7 5.2 11.2* 11.0*  --  6.4  CREATININE 1.15 1.19 1.17  --  1.07 1.18    Estimated Creatinine Clearance: 55.8 mL/min (by C-G formula based on SCr of 1.18 mg/dL).    No Known Allergies  Antimicrobials this admission: Cefepime 1/15 >>  Vanc 1/15  Microbiology: 1/15 UCx + 100K GNR 1/15 BCx: ngtd 1/5 Covid neg   2/15, PharmD, BCPS, [facility] Clinical Pharmacist  Please check AMION for all [facility] Pharmacy phone numbers After 10:00 PM, call Main Pharmacy [phone]

## 2019-07-04 DIAGNOSIS — Z79899 Other long term (current) drug therapy: Secondary | ICD-10-CM

## 2019-07-04 DIAGNOSIS — W19XXXA Unspecified fall, initial encounter: Secondary | ICD-10-CM

## 2019-07-04 DIAGNOSIS — S2232XA Fracture of one rib, left side, initial encounter for closed fracture: Secondary | ICD-10-CM

## 2019-07-04 DIAGNOSIS — I69398 Other sequelae of cerebral infarction: Principal | ICD-10-CM

## 2019-07-04 LAB — CBC
HCT: 36.5 % — ABNORMAL LOW (ref 39.0–52.0)
Hemoglobin: 12.2 g/dL — ABNORMAL LOW (ref 13.0–17.0)
MCH: 30 pg (ref 26.0–34.0)
MCHC: 33.4 g/dL (ref 30.0–36.0)
MCV: 89.7 fL (ref 80.0–100.0)
Platelets: 241 10*3/uL (ref 150–400)
RBC: 4.07 MIL/uL — ABNORMAL LOW (ref 4.22–5.81)
RDW: 13.4 % (ref 11.5–15.5)
WBC: 4.3 10*3/uL (ref 4.0–10.5)
nRBC: 0 % (ref 0.0–0.2)

## 2019-07-04 LAB — BASIC METABOLIC PANEL
Anion gap: 9 (ref 5–15)
BUN: 14 mg/dL (ref 8–23)
CO2: 25 mmol/L (ref 22–32)
Calcium: 9.2 mg/dL (ref 8.9–10.3)
Chloride: 106 mmol/L (ref 98–111)
Creatinine, Ser: 1.09 mg/dL (ref 0.61–1.24)
GFR calc Af Amer: >60 mL/min (ref >60)
GFR calc non Af Amer: >60 mL/min (ref >60)
Glucose, Bld: 96 mg/dL (ref 70–99)
Potassium: 3.8 mmol/L (ref 3.5–5.1)
Sodium: 140 mmol/L (ref 135–145)

## 2019-07-04 LAB — URINE CULTURE: Culture: >=100000 — AB

## 2019-07-04 MED ORDER — OLANZAPINE 10 MG IM SOLR
2.5000 mg | Freq: Once | INTRAMUSCULAR | Status: DC
  Filled 2019-07-04: qty 10

## 2019-07-04 MED ORDER — CEPHALEXIN 500 MG PO CAPS
500.0000 mg | ORAL_CAPSULE | Freq: Two times a day (BID) | ORAL | Status: DC
  Administered 2019-07-04 – 2019-07-07 (×7): 500 mg via ORAL
  Filled 2019-07-04 (×7): qty 1

## 2019-07-04 MED ORDER — OLANZAPINE 2.5 MG PO TABS
5.0000 mg | ORAL_TABLET | Freq: Every day | ORAL | Status: DC
  Administered 2019-07-04 – 2019-07-07 (×5): 5 mg via ORAL
  Filled 2019-07-04 (×5): qty 2

## 2019-07-04 NOTE — Progress Notes (Signed)
Physical Therapy Treatment Patient Details Name: Jordan Sawyer MRN: [id number] DOB: May 18, 1946 Today's Date: 07/04/2019    History of Present Illness Pt is a 74 y.o. male admitted 06/19/19 after fall at home; sustained 11th rib fx. EEG captured myoclonic jerks and started on klonopin. 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    PT Comments    Pt progressing well towards his physical therapy goals and motivated to participate with therapy. Requiring min assist for transfers, ambulating 60 feet with a walker at mod assist level. Continues to present as a high fall risk based on balance deficits, decreased gait speed, and safety awareness. Continue to recommend SNF for ongoing Physical Therapy.      Follow Up Recommendations  SNF;Supervision for mobility/OOB     Equipment Recommendations  None recommended by PT    Recommendations for Other Services       Precautions / Restrictions Precautions Precautions: Fall Restrictions Weight Bearing Restrictions: No    Mobility  Bed Mobility Overal bed mobility: Needs Assistance Bed Mobility: Supine to Sit     Supine to sit: Min assist     General bed mobility comments: Use of bed rail, minA to pull up to sitting position  Transfers Overall transfer level: Needs assistance Equipment used: Rolling walker (2 wheeled) Transfers: Sit to/from Stand Sit to Stand: Min assist         General transfer comment: MinA to power up from edge of bed and toilet  Ambulation/Gait Ambulation/Gait assistance: Mod assist;+2 safety/equipment Gait Distance (Feet): 60 Feet Assistive device: Rolling walker (2 wheeled) Gait Pattern/deviations: Decreased stride length;Step-through pattern;Trunk flexed;Decreased dorsiflexion - left;Decreased weight shift to left Gait velocity:  Decreased   General Gait Details: ModA for stability and close chair follow utilized. Mod verbal cues for upright posture and walker proximity; pt able to correct but not maintain. Pt with tendency for LLE drag with fatigue   Stairs             Wheelchair Mobility    Modified Rankin (Stroke Patients Only) Modified Rankin (Stroke Patients Only) Pre-Morbid Rankin Score: No symptoms Modified Rankin: Moderately severe disability     Balance Overall balance assessment: Needs assistance Sitting-balance support: Bilateral upper extremity supported;Feet supported Sitting balance-Leahy Scale: Fair     Standing balance support: Bilateral upper extremity supported;During functional activity;Single extremity supported Standing balance-Leahy Scale: Poor Standing balance comment: Reliant on UE support                            Cognition Arousal/Alertness: Awake/alert Behavior During Therapy: WFL for tasks assessed/performed Overall Cognitive Status: History of cognitive impairments - at baseline Area of Impairment: Safety/judgement;Following commands;Memory;Problem solving;Awareness                     Memory: Decreased short-term memory Following Commands: Follows one step commands inconsistently Safety/Judgement: Decreased awareness of safety;Decreased awareness of deficits   Problem Solving: Requires verbal cues;Requires tactile cues;Difficulty sequencing General Comments: Requires repetitive verbal cues for problem solving. Difficulty with short term memory recall      Exercises      General Comments        Pertinent Vitals/Pain Pain Assessment: Faces Faces Pain Scale: No hurt    Home Living  Prior Function            PT Goals (current goals can now be found in the care plan section) Acute Rehab PT Goals Patient Stated Goal: return home Potential to Achieve Goals: Fair Progress towards PT goals: Progressing  toward goals    Frequency    Min 2X/week      PT Plan Current plan remains appropriate    Co-evaluation              AM-PAC PT "6 Clicks" Mobility   Outcome Measure  Help needed turning from your back to your side while in a flat bed without using bedrails?: A Little Help needed moving from lying on your back to sitting on the side of a flat bed without using bedrails?: A Little Help needed moving to and from a bed to a chair (including a wheelchair)?: A Little Help needed standing up from a chair using your arms (e.g., wheelchair or bedside chair)?: A Little Help needed to walk in hospital room?: A Lot Help needed climbing 3-5 steps with a railing? : Total 6 Click Score: 15    End of Session Equipment Utilized During Treatment: Gait belt Activity Tolerance: Patient tolerated treatment well Patient left: in chair;with call bell/phone within reach;with chair alarm set Nurse Communication: Mobility status PT Visit Diagnosis: Unsteadiness on feet (R26.81);Repeated falls (R29.6);History of falling (Z91.81) Hemiplegia - Right/Left: Left Hemiplegia - dominant/non-dominant: Non-dominant Hemiplegia - caused by: Cerebral infarction     Time: 1443-1540 PT Time Calculation (min) (ACUTE ONLY): 25 min  Charges:  $Gait Training: 8-22 mins $Therapeutic Activity: 8-22 mins                     Laurina Bustle, PT, DPT Acute Rehabilitation Services Pager [phone] Office [phone]    [name] 07/04/2019, 4:39 PM

## 2019-07-04 NOTE — Progress Notes (Addendum)
Pt agitated and trying to get out of bed and out of recliner many times saying, "I need to go see if Cathren Harsh is alright, and "I got to go to work." Patient sexually aggressive with this RN both verbally and grabbing at her chest.  Zyprexa given but behavior has not changed. Pt gets angry and pulls back arm as if about to hit RN when gently corrects behavior. MD aware.

## 2019-07-04 NOTE — Progress Notes (Signed)
At 0415, heard pt's bed alarm go off. Noted patient on the side of the bed and trying to get out of bed. Noted pt was naked and threw gown on the floor. Noted pulled out IV and blood on arm and leg. Also pulled out condom cath. Pt soiled his linen. Pt oriented x2. Pt follow commands but can be agitated when asked questions.

## 2019-07-04 NOTE — Progress Notes (Addendum)
Subjective: HD#15   Overnight: No acute events  Today, Jordan Sawyer is laying comfortably in bed in no acute distress. He is pleasant and conversant and thanked [place] for our care. He has no complaints or concerns. He does report some mild abdominal pain but reports regular bowel movements. He is hopeful to be discharged to a SNF soon and looking forward to going home after that.   Objective:  Vital signs in last 24 hours: Vitals:   07/03/19 1714 07/03/19 2006 07/04/19 0341 07/04/19 0725  BP: (!) 147/72 134/80 (!) 153/89 (!) 151/89  Pulse: 87 93 97 94  Resp: 18 17 17    Temp: 98.2 F (36.8 C) 97.7 F (36.5 C) (!) 97.5 F (36.4 C) 98.4 F (36.9 C)  TempSrc: Oral Oral Oral Oral  SpO2: 100% 100% 98% (!) 88%  Weight:      Height:        Const: In no apparent distress, lying comfortably in bed, conversational HEENT: Atraumatic, normocephalic Resp: CTA BL, no wheezes, crackles, rhonchi CV: RRR, no murmurs, gallop, rub Abd: Bowel sounds present, nondistended, nontender to palpation Ext: No lower extremity edema, skin is warm to touch Neuro: Cranial nerves II to XII grossly intact without focal neurological deficits Skin: Warm, no rashes  Assessment/Plan:  Principal Problem:   Neurological movement disorder Active Problems:   Agitation   Essential hypertension   Tobacco abuse   Frequent falls   Closed traumatic minimally displaced fracture of one rib of left side   Aortic atherosclerosis (HCC)   Myoclonic jerking   Palliative care by specialist   Goals of care, counseling/discussion   Delirium   Constipation   Muscular weakness   Xerostomia   Spiritual or religious counseling  In summary,Jordan Sawyer is a 74 year old male with past medical history significant for dementia, 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 Work up showed a Klebsiella UTI being treated w/ Cefepime with improvement in alertness.   #UTI -Klebsiella: -Bld cx no growth 2 days -Urine cx grew Klebsiella sensitive to cephalosporins -Transition cefepime to oral Keflex 500 mg BID x 6 days -Pt may need sleep study once discharged for apneic episodies  #Hx CVA #MultipleFalls #Myoclonic jerks: Myoclonic jerking has improved since adding Klonopin 0.25 mg twice daily. EEG did not demonstrate any evidence of seizures. MRI demonstrated chronic small vessel ischemic changes throughout the brain as well as old small vessel infarctions of the right thalamus and right posterior basal ganglia. -Neuro signed off, appreciate recs -Continue to hold off klonopin 0.25 mg BID. If myoclonic jerkin returns, will restart. -PT/OT/SLP consulted, recommend SNF. -Aspirin 81 mg daily  #Insomnia #Dementia -RESUMEZyprexa 5 mg nightly -HOLD ramelteon 8mg  nightly -Attempt redirection PRN  #L 11th rib [birth year]20[birth year]1971 by fall prior to this hospitalization.Confirmed by CT of the chest on admission. Pt states pain is well controlled. -Tylenol 650 mg every 6 hours as needed  #COPD #Tobacco abuse -Nicotine patch 21 mg  #HTN: -Amlodipine 5 mg daily -Lisinopril 20 mg daily   #HLD -Lipitor 20 mg daily  #Hypothyroidism -Synthroid 100 mcg   #GERD -Protonix 40 mg   #DVT prophylaxis -Lovenox 40 mg subq injections daily  #CODE STATUS: Family desires full scope of care with hope to transition to  SNF and outpatient palliative follow-up  #Dispo:Pending medical course.  Jordan Decant, MD 07/04/2019, 9:16 AM Pager: [phone] Internal Medicine Teaching Service

## 2019-07-04 NOTE — Progress Notes (Addendum)
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  Today's Discussion (07/04/2019): Reviewed chart. Spoke with patients daughter, bedside nurse. Per her report she had been in with him much of the day due to his delirium. It appears that a sitter may be of utility in an effort to prevent additional falls. PO intake has increased positively.   Called patients daughter Jordan Sawyer, she brought up an array of concerns inclusive of his ongoing delirium. Shared with her that the primary team had just restarted his olanzapine which may help. She asked why he is not on all of the medications he was on at home, I told her often there are reasons the medical team holds off on certain medications depending upon how patients were when they were admitted. Requested a medication reconciliation to help verify all meds are appropriately captured.   Jordan Sawyer shared that she would like for the patient to go to rehabilitation, ideally restored to his previous level of function. Shared that much of his ability to improve will be weighed by delirium resolution.  She expressed that on the last occasion we met she felt very overwhelmed by her fathers health state and the information she was being given. Clarified that Palliative care does not mean dad is dying, rather we are an additional layer of support for she and her family. She stated that she understood.   Discussed  the importance of continued conversation with family and their medical providers regarding overall plan of care and treatment options, ensuring decisions are within the context of the patients values and GOCs.  Questions and concerns addressed   Subjective Assessment: Patient endorses that the food is very good.  Denies chest pain or shortness of breath.   Vital Signs Vitals:   07/04/19 0341 07/04/19 0725  BP: (!) 153/89 (!) 151/89  Pulse: 97 94  Resp: 17   Temp: (!) 97.5 F (36.4 C) 98.4 F (36.9 C)  SpO2: 98% (!) 88%   No intake or output data in the 24 hours ending 07/04/19 1842 Last Weight  Most recent update: 06/25/2019  6:25 AM   Weight  78 kg (171 lb 15.3 oz)           Gen:  Elderly m in NAD HEENT: dry mucous membranes CV: Regular rate and rhythm, no murmurs rubs or gallops PULM: clear to auscultation bilaterally ABD: soft/nontender EXT: No edema Neuro: Alert and oriented to self and place  Recommendations and Plan: Goals of Care:  - Full code  - To get rehabilitation, improve physical function  Pain, L rib:  - Tylenol 557m PO TID  - Voltaren 4g TID  - Heat PRN  Delirium, likely driven by UTI:                 - Delirium precautions                 - Encourage a familiar face to remain present throughout the day                 - Keep blinds open and lights on during daylight hours    - Get up during the day                 - Minimize the use of opioids/benzodiazepines  -  Noticed that olanzapine was restarted --> If not improving could trial Seroquel 62m PO at 1700 daily to aid in restoring circadian rhythm (in replacement of olanzapine)  - Consider a safety sitter if staffing able to accommodate as patient is extremely high fall risk (two recent falls)                  Constipation:                 - Miralax 17g PO QDay  Muscular Weakness:                 - PT                 - OT  Xerostomia:                 - Good oral care QShift                 - Encourage liquid intake  Polypharmacy:  - Medication reconcilation  Spiritual:                 - Chaplain consult  Dispo:                 - Hope to transition to SNF ([facility]                 - OP Palliative follow up  Time Spent: 25 Greater than 50% of the time was spent in counseling and  coordination of care ______________________________________________________________________________________ MB[place] CityTeam Team Cell Phone: [phone] utilize secure chat with additional questions, if there is no response within 30 minutes please call the above phone number  Palliative Medicine Team providers are available by phone from 7am to 7pm daily and can be reached through the team cell phone.  Should this patient require assistance outside of these hours, please call the patient's attending physician.

## 2019-07-05 LAB — SARS CORONAVIRUS 2 (TAT 6-24 HRS): SARS Coronavirus 2: NEGATIVE

## 2019-07-05 MED ORDER — OLANZAPINE 10 MG IM SOLR
2.5000 mg | Freq: Once | INTRAMUSCULAR | Status: AC
  Administered 2019-07-05: 03:00:00 2.5 mg via INTRAMUSCULAR
  Filled 2019-07-05: qty 10

## 2019-07-05 MED ORDER — RAMELTEON 8 MG PO TABS
8.0000 mg | ORAL_TABLET | Freq: Every day | ORAL | Status: DC
  Administered 2019-07-05 – 2019-07-06 (×2): 8 mg via ORAL
  Filled 2019-07-05 (×3): qty 1

## 2019-07-05 NOTE — Progress Notes (Addendum)
Paged about repeat agitated behavior. At approximately 745pm, I spoke with day-shift RN and was informed that patient had pulled his arm back in a potentially aggressive posture but had not demonstrated any direct physical aggression.   However, upon chart review, I note at this time that day-shift RN states patient to have demonstrated sexually aggressive behavior both verbally as well as grabbing at chest of RN.   Patient evaluated at bedside. Patient is confused, asked same questions repeatedly and frequently attempts to get out of bed.  Plan: * To control patient agitation/aggression I will place order for zyprexa 2.5 mg IM and consider repeat administration for continued symptoms  Katherine Roan, MD 07/05/2019, 1:12 AM

## 2019-07-05 NOTE — Progress Notes (Signed)
PT HAS BEEN MOSTLY DIRECTABLE TODAY WITH STAFF. SOME INTERVALS OF CONFUSION NOTED, WITHOUT AGGRESSION AND WITH RAPID REORIENTATION FOLLOWING. OOB TO CHAIR WITH MOD ASSIST. ABLE TO COMMUNICATE IMMEDIATE NEEDS TO STAFF APPROPRIATELY.

## 2019-07-05 NOTE — Progress Notes (Signed)
Pt agitated and pulled out IV again around 0030. MD ordered IM zyprexa, pt was asleep for a few hours after. Now he is agitated again and pulled out tele box and got out of bed and urinated on the mat. Sitting on the side of the bed drinking coffee. Asked the same questions "what's going on?" repeatedly. Can be verbally aggressive and raised his voice .

## 2019-07-05 NOTE — Progress Notes (Addendum)
Subjective:   Overnight: Around 8 PM last night, the night team was informed the patient pulled his arm back in a potentially aggressive posture, but did not demonstrate any direct physical aggression.  Patient seen at the bedside this morning.  Patient states he is frustrated because he says no one is telling him what is going on.  I told the patient he was in the hospital and that we are treating a urine infection.  I also told him we were trying to get him to go to the nursing facility to continue getting rehab. Patient is agreeable to this.  I also asked the patient to let the nurses know if he feels that his need to get up bed prevent him from falling.  Patient voices understanding.  All concerns were addressed.  Objective: CBC Latest Ref Rng & Units 07/04/2019 07/03/2019 07/02/2019  WBC 4.0 - 10.5 K/uL 4.3 6.4 11.0(H)  Hemoglobin 13.0 - 17.0 g/dL 12.2(L) 11.7(L) 13.0  Hematocrit 39.0 - 52.0 % 36.5(L) 36.0(L) 38.9(L)  Platelets 150 - 400 K/uL 241 228 245   BMP Latest Ref Rng & Units 07/04/2019 07/03/2019 07/02/2019  Glucose 70 - 99 mg/dL 96 035(K) 093(G)  BUN 8 - 23 mg/dL 14 22 23   Creatinine 0.61 - 1.24 mg/dL 1.82 9.93  Sodium 135 - 145 mmol/L 140 137 138  Potassium 3.5 - 5.1 mmol/L 3.8 3.8 4.0  Chloride 98 - 111 mmol/L 106 107 106  CO2 22 - 32 mmol/L 25 22 20(L)  Calcium 8.9 - 10.3 mg/dL 9.2 7.16) 9.6(V)   Vital signs in last 24 hours: Vitals:   07/04/19 0725 07/04/19 1955 07/05/19 0038 07/05/19 0240  BP: (!) 151/89 136/84 140/80 (!) 156/109  Pulse: 94 84 92 93  Resp:  20 20 20   Temp: 98.4 F (36.9 C) 97.8 F (36.6 C) 97.6 F (36.4 C) 97.8 F (36.6 C)  TempSrc: Oral Oral Oral Oral  SpO2: (!) 88% 100% 97% 95%  Weight:      Height:       Physical Exam General: Laying in bed, NAD HEENT: NCAT CV: RRR, normal S1-S2, no murmurs rubs or gallops appreciated PULM: Clear to auscultation bilaterally, no crackles or wheezes appreciated ABD: Soft and nontender in all  quadrants.  GU: No suprapubic tenderness noted Neuro: Alert but not oriented, no focal deficits noted  Assessment/Plan:  Principal Problem:   Neurological movement disorder Active Problems:   Agitation   Essential hypertension   Tobacco abuse   Frequent falls   Closed traumatic minimally displaced fracture of one rib of left side   Aortic atherosclerosis (HCC)   Myoclonic jerking   Palliative care by specialist   Goals of care, counseling/discussion   Delirium   Constipation   Muscular weakness   Xerostomia   Spiritual or religious counseling   Polypharmacy  In summary,Jordan Sawyer is a 74 year old male with past medical history significant for dementia, 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 Work up showed a Klebsiella UTI.  #Klebsiella pneumoniae UTI: Urine cultures grew Klebsiella pneumonia sensitive to all cephalosporins.  Patient was initially on cefepime then transition to oral Keflex. -Continue Keflex 500 mg BID (day 4/10) -Repeat blood cultures no growth to date  #Hx CVA #MultipleFalls #Myoclonic  jerks: Myoclonic jerking has improved since adding Klonopin 0.25 mg twice daily. EEG did not demonstrate any evidence of seizures. MRI demonstrated chronic small vessel ischemic changes throughout the brain as well as old small vessel infarctions of the right thalamus and right posterior basal ganglia. -Neuro signed off, appreciate recs -Continue to hold off klonopin 0.25 mg BID. If myoclonic jerkin returns, will restart. -PT/OT/SLP consulted, recommend SNF. -Aspirin 81 mg daily  #Insomnia #Dementia -Zyprexa 5 mg nightly -Ramelteon 8mg  nightly -Attempt  redirection PRN  #Apneic spells -We will recommend sleep study upon discharge  #L 11th rib UX:NATFTD by fall prior to this hospitalization.Confirmed by CT of the chest on admission. Pt states pain is well controlled. -Tylenol 650 mg every 6 hours as needed -Voltaren gel as needed  #COPD #Tobacco abuse -Nicotine patch 21 mg  #HTN: -Amlodipine 5 mg daily -Lisinopril 20 mg daily   #HLD -Lipitor 20 mg daily  #Hypothyroidism -Synthroid 100 mcg   #GERD -Protonix 40 mg   #DVT prophylaxis -Lovenox 40 mg subq injections daily  #CODE STATUS: FULL   #Dispo:Patient is medically stable for discharge.  Pending SNF placement.  Will follow up with palliative in outpatient setting  Earlene Plater, MD Internal Medicine, PGY1 Pager: [phone]  07/05/2019,8:01 AM

## 2019-07-05 NOTE — Progress Notes (Signed)
CM has left message for admissions at [facility] and for [facility] to see about bed availability. No responses yet. CM following.

## 2019-07-06 DIAGNOSIS — B961 Klebsiella pneumoniae [K. pneumoniae] as the cause of diseases classified elsewhere: Secondary | ICD-10-CM

## 2019-07-06 DIAGNOSIS — F0390 Unspecified dementia without behavioral disturbance: Secondary | ICD-10-CM

## 2019-07-06 DIAGNOSIS — N390 Urinary tract infection, site not specified: Secondary | ICD-10-CM

## 2019-07-06 LAB — BASIC METABOLIC PANEL
Anion gap: 7 (ref 5–15)
BUN: 18 mg/dL (ref 8–23)
CO2: 24 mmol/L (ref 22–32)
Calcium: 8.8 mg/dL — ABNORMAL LOW (ref 8.9–10.3)
Chloride: 110 mmol/L (ref 98–111)
Creatinine, Ser: 1.2 mg/dL (ref 0.61–1.24)
GFR calc Af Amer: >60 mL/min (ref >60)
GFR calc non Af Amer: 59 mL/min — ABNORMAL LOW (ref >60)
Glucose, Bld: 96 mg/dL (ref 70–99)
Potassium: 3.9 mmol/L (ref 3.5–5.1)
Sodium: 141 mmol/L (ref 135–145)

## 2019-07-06 LAB — CBC
HCT: 35.4 % — ABNORMAL LOW (ref 39.0–52.0)
Hemoglobin: 11.5 g/dL — ABNORMAL LOW (ref 13.0–17.0)
MCH: 29.6 pg (ref 26.0–34.0)
MCHC: 32.5 g/dL (ref 30.0–36.0)
MCV: 91.2 fL (ref 80.0–100.0)
Platelets: 259 10*3/uL (ref 150–400)
RBC: 3.88 MIL/uL — ABNORMAL LOW (ref 4.22–5.81)
RDW: 13.5 % (ref 11.5–15.5)
WBC: 4.6 10*3/uL (ref 4.0–10.5)
nRBC: 0 % (ref 0.0–0.2)

## 2019-07-06 MED ORDER — ENSURE ENLIVE PO LIQD
237.0000 mL | Freq: Two times a day (BID) | ORAL | Status: DC
  Administered 2019-07-06 – 2019-07-07 (×2): 237 mL via ORAL

## 2019-07-06 NOTE — Plan of Care (Signed)
  Problem: Health Behavior/Discharge Planning: Goal: Ability to manage health-related needs will improve Outcome: Progressing   Problem: Clinical Measurements: Goal: Ability to maintain clinical measurements within normal limits will improve Outcome: Progressing Goal: Will remain free from infection Outcome: Progressing Goal: Diagnostic test results will improve Outcome: Progressing Goal: Respiratory complications will improve Outcome: Progressing Goal: Cardiovascular complication will be avoided Outcome: Progressing   Problem: Activity: Goal: Risk for activity intolerance will decrease Outcome: Progressing   Problem: Nutrition: Goal: Adequate nutrition will be maintained Outcome: Progressing   Problem: Pain Managment: Goal: General experience of comfort will improve Outcome: Progressing   Problem: Safety: Goal: Ability to remain free from injury will improve Outcome: Progressing   

## 2019-07-06 NOTE — Progress Notes (Signed)
Spoke to patient's daughter MiMi today to give an update on patient's status. MiMi verbalized understanding that the patient would not be discharged today. MiMi stated that she will come visit her father tomorrow.

## 2019-07-06 NOTE — Progress Notes (Signed)
Occupational Therapy Treatment Patient Details Name: Jordan Sawyer MRN: [id number] DOB: 1945-11-02 Today's Date: 07/06/2019    History of present illness Pt is a 74 y.o. male admitted 06/19/19 after fall at home; sustained 11th rib fx. EEG captured myoclonic jerks and started on klonopin. 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   OT comments  Pt steadily progressing toward established goals. Upon arrival, pt was curled up towards foot of the bed, asleep. Pleasantly awoke to therapist knocking and agreeable to OT session. Pt woke up verbalizing it was too early to eat breakfast, oriented pt to time 1:30pm. He donned socks while sitting EOB requiring increased time for motor planning and cues for initiation. He is limited this session by fatigue, pt reports he has been tired all day, however RN reports pt was very active this morning. He transferred to recliner and required setupA for eating. Pt will continue to benefit from skilled OT services to address motor planning, cognition, and stability and safety with ADL completion. Will continue to follow acutely and progress as tolerated.    Follow Up Recommendations  SNF;Supervision/Assistance - 24 hour    Equipment Recommendations  3 in 1 bedside commode    Recommendations for Other Services      Precautions / Restrictions Precautions Precautions: Fall Precaution Comments: history of frequent falls, posterior lean increases with fatigue       Mobility Bed Mobility Overal bed mobility: Needs Assistance Bed Mobility: Supine to Sit     Supine to sit: Min assist     General bed mobility comments: minA to progress trunk into upright seated position  Transfers Overall transfer level: Needs assistance Equipment used: Rolling walker (2 wheeled) Transfers: Sit  to/from [facility] Sit to Stand: Min assist Stand pivot transfers: Mod assist       General transfer comment: minA for safe hand placement, slightly elevated bed;modA for stand pivot increasing posterior lean     Balance Overall balance assessment: Needs assistance Sitting-balance support: Bilateral upper extremity supported;Feet supported Sitting balance-Leahy Scale: Fair Sitting balance - Comments: min guard A static sitting   Standing balance support: Bilateral upper extremity supported;During functional activity;Single extremity supported Standing balance-Leahy Scale: Poor Standing balance comment: Reliant on UE support                           ADL either performed or assessed with clinical judgement   ADL Overall ADL's : Needs assistance/impaired Eating/Feeding: Set up;Sitting Eating/Feeding Details (indicate cue type and reason): setup with lunch, pt took bite of ice cream, required slight opening of container                     Toilet Transfer: Moderate assistance;RW;Stand-pivot Toilet Transfer Details (indicate cue type and reason): simulated to recliner         Functional mobility during ADLs: Moderate assistance;Rolling walker;Cueing for sequencing General ADL Comments: minA for stability in standing;unable to let go of support on RW in standing;posterior lean increased with stepping     Vision       Perception     Praxis      Cognition Arousal/Alertness: Lethargic Behavior During Therapy: WFL for tasks assessed/performed Overall Cognitive Status: No family/caregiver present to determine baseline cognitive functioning Area of Impairment: Safety/judgement;Following commands;Memory;Problem solving;Awareness  Memory: Decreased short-term memory Following Commands: Follows one step commands inconsistently Safety/Judgement: Decreased awareness of safety;Decreased awareness of  deficits Awareness: Intellectual Problem Solving: Requires verbal cues;Requires tactile cues;Difficulty sequencing General Comments: pt asleep upon arrival, thought it was the morning and unable to recall events of AM, per RN pt has been very active all day, pt reports he has been tired all day;vc for task initiation         Exercises     Shoulder Instructions       General Comments      Pertinent Vitals/ Pain       Pain Assessment: No/denies pain Pain Intervention(s): Monitored during session  Home Living                                          Prior Functioning/Environment              Frequency  Min 2X/week        Progress Toward Goals  OT Goals(current goals can now be found in the care plan section)  Progress towards OT goals: Progressing toward goals  Acute Rehab OT Goals Patient Stated Goal: return home to see wife OT Goal Formulation: With patient Time For Goal Achievement: 07/20/19 Potential to Achieve Goals: Good ADL Goals Pt Will Perform Grooming: with modified independence;sitting;standing Pt Will Perform Upper Body Dressing: with modified independence;sitting;standing Pt Will Perform Lower Body Dressing: with modified independence;sit to/from stand Pt Will Transfer to Toilet: with modified independence;ambulating Pt Will Perform Toileting - Clothing Manipulation and hygiene: with modified independence;sit to/from stand  Plan Discharge plan remains appropriate    Co-evaluation                 AM-PAC OT "6 Clicks" Daily Activity     Outcome Measure   Help from another person eating meals?: A Little Help from another person taking care of personal grooming?: A Little Help from another person toileting, which includes using toliet, bedpan, or urinal?: A Little Help from another person bathing (including washing, rinsing, drying)?: A Little Help from another person to put on and taking off regular upper body clothing?: A  Little Help from another person to put on and taking off regular lower body clothing?: A Little 6 Click Score: 18    End of Session Equipment Utilized During Treatment: Gait belt;Rolling walker  OT Visit Diagnosis: Unsteadiness on feet (R26.81);Other abnormalities of gait and mobility (R26.89);Muscle weakness (generalized) (M62.81);History of falling (Z91.81)   Activity Tolerance Patient limited by lethargy   Patient Left in chair;with call bell/phone within reach;with chair alarm set   Nurse Communication Mobility status        Time: 4332-9518 OT Time Calculation (min): 16 min  Charges: OT General Charges $OT Visit: 1 Visit OT Treatments $Self Care/Home Management : 8-22 mins  Diona Browner OTR/L Acute Rehabilitation Services Office: [phone]    Jordan Sawyer Alert 07/06/2019, 3:17 PM

## 2019-07-06 NOTE — TOC Progression Note (Addendum)
Transition of Care [facility]) - Progression Note    Patient Details  Name: Jordan Sawyer MRN: [id number] Date of Birth: 1946-03-09  Transition of [facility] CM/SW Contact  Kermit Balo, RN Phone Number: 07/06/2019, 1:24 PM  Clinical Narrative:    Have attempted to reach admissions at [facility] without success. Left voicemail yesterday and today. Awaiting to hear from [place] at [facility]: Lambert Rhonda W. Have left multiple texts and voicemail. Have sent over notes of him being calm the last 24 hours.  Have also faxed him out to other facilities. TOC following.   Expected Discharge Plan: Skilled Nursing Facility Barriers to Discharge: Continued Medical Work up  Expected Discharge Plan and Services Expected Discharge Plan: Skilled Nursing Facility In-house Referral: Clinical Social Work Discharge Planning Services: CM Consult Post Acute Care Choice: Skilled Nursing Facility Living arrangements for the past 2 months: Single Family Home Expected Discharge Date: 06/28/19                                     Social Determinants of Health (SDOH) Interventions    Readmission Risk Interventions Readmission Risk Prevention Plan 12/18/2018  Post Dischage Appt Not Complete  Appt Comments Office closed on 7/3  Medication Screening Complete  Transportation Screening Complete  Some recent data might be hidden

## 2019-07-06 NOTE — Progress Notes (Signed)
Patient is in bed this a.m. no c/o pain or discomfort voiced. Patient is pleasant and cooperative with care. Patient is confused but easily redirected at this time. No aggressive behavior noted at this time. Patient stated he is ready to go to rehab so he can get back home to his family. Will continue to monitor throughout the shift.

## 2019-07-06 NOTE — Progress Notes (Signed)
   Subjective: HD#17   Overnight: No reports of agitation, remained calm 39  Today, IOKEPA GEFFRE was examined at bedside pleasantly lying comfortably in chair.  He was very cooperative this morning very jovial.  He states he is doing well and denies any complaints whatsoever.  He is anticipating discharge from the hospital to a facility and states that he misses his family and wants to see them.  Objective:  Vital signs in last 24 hours: Vitals:   07/05/19 2012 07/06/19 0006 07/06/19 0322 07/06/19 0804  BP: 118/79 (!) 153/87 130/86 136/89  Pulse: 89 89 76 87  Resp: 16 16 16 19   Temp: 98 F (36.7 C) 97.8 F (36.6 C) 97.6 F (36.4 C) 97.8 F (36.6 C)  TempSrc:  Oral Oral Oral  SpO2:  96% 97% 97%  Weight:      Height:       Const: In no apparent distress, lying comfortably in bed, conversational CV: RRR, no murmurs, gallop, rub   Assessment/Plan:  Principal Problem:   Neurological movement disorder Active Problems:   Agitation   Essential hypertension   Tobacco abuse   Frequent falls   Closed traumatic minimally displaced fracture of one rib of left side   Aortic atherosclerosis (HCC)   Myoclonic jerking   Palliative care by specialist   Goals of care, counseling/discussion   Delirium   Constipation   Muscular weakness   Xerostomia   Spiritual or religious counseling   Polypharmacy  In summary,Mr. 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 Work up showed a Klebsiella UTI.  #Klebsiella pneumoniae UTI:  -Continue Keflex 500 mg BID (day 3/6)  #Hx  CVA #MultipleFalls #Myoclonic jerks: Myoclonic jerking has improved since adding Klonopin 0.25 mg twice daily. EEG did not demonstrate any evidence of seizures. MRI demonstrated chronic small vessel ischemic changes throughout the brain as well as old small vessel infarctions of the right thalamus and right posterior basal ganglia. -Neuro signed off, appreciate recs -Continue to hold offklonopin 0.25 mg BID. If myoclonic jerkin returns, will restart. -PT/OT/SLP consulted, recommend SNF. -Aspirin 81 mg daily  #Insomnia #Dementia -Zyprexa 5 mg nightly -Ramelteon 8mg  nightly -Attempt redirection PRN  #Apneic spells -We will recommend sleep study upon discharge  #L 11th rib [birth year]1971 by fall prior to this hospitalization.Confirmed by CT of the chest on admission. Pt states pain is well controlled. -Tylenol 650 mg every 6 hours as needed -Voltaren gel as needed  #COPD #Tobacco abuse -Nicotine patch 21 mg  #HTN: -Amlodipine 5 mg daily -Lisinopril 20 mg daily   #HLD -Lipitor 20 mg daily  #Hypothyroidism -Synthroid 100 mcg   #GERD -Protonix 40 mg   #DVT prophylaxis -Lovenox 40 mg subq injections daily  #CODE STATUS: FULL  #Dispo:Patient is medically stable for discharge.  Pending SNF placement.  Will follow up with palliative in outpatient setting  1.  Where patient is from: Home 2.  Anticipate discharge to: SNF 3.  Barriers to discharge: Pending bed availability    , MD 07/06/2019, 11:08 AM Pager: [phone] Internal Medicine Teaching Service

## 2019-07-06 NOTE — Progress Notes (Signed)
Nutrition Follow-up  DOCUMENTATION CODES:   Not applicable  INTERVENTION:  Provide Ensure Enlive po BID, each supplement provides 350 kcal and 20 grams of protein.  Encourage adequate PO intake.   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 Pt admitted with frequent falls. While awaiting SNF placement, he was found to haveapneic spells with desaturations to 84%, as well as somnolence and difficulty toarouse. Work up showed a Klebsiella UTI  Pt continues on a dysphagia 1 diet with thin liquids. Meal completion has been 40-75%. RD to order nutritional supplements to aid in caloric and protein needs. Pending SNF placement.   Labs and medications reviewed.   Diet Order:   Diet Order            Diet - low sodium heart healthy        Diet - low sodium heart healthy        Diet - low sodium heart healthy        DIET - DYS 1 Room service appropriate? Yes; Fluid consistency: Thin  Diet effective now              EDUCATION NEEDS:   No education needs have been identified at this time  Skin:  Skin Assessment: Reviewed RN Assessment  Last BM:  1/17  Height:   Ht Readings from Last 1 Encounters:  06/19/19 5\' 10"  (1.778 m)    Weight:   Wt Readings from Last 1 Encounters:  06/25/19 78 kg    Ideal Body Weight:  75.5 kg  BMI:  Body mass index is 24.67 kg/m.  Estimated Nutritional Needs:   Kcal:   1700-1900  Protein:  85-100 grams  Fluid:  >1.7 L    08/23/19, MS, RD, LDN Pager # [phone] After hours/ weekend pager # [phone]

## 2019-07-07 LAB — CULTURE, BLOOD (ROUTINE X 2)
Culture: NO GROWTH
Culture: NO GROWTH
Special Requests: ADEQUATE
Special Requests: ADEQUATE

## 2019-07-07 MED ORDER — CEPHALEXIN 500 MG PO CAPS: 500.0000 mg | ORAL_CAPSULE | Freq: Two times a day (BID) | ORAL | 0 refills | Status: AC

## 2019-07-07 MED ORDER — OLANZAPINE 5 MG PO TABS: 5.0000 mg | ORAL_TABLET | Freq: Every day | ORAL | 2 refills | Status: AC

## 2019-07-07 MED ORDER — OLANZAPINE 5 MG PO TABS: 5.0000 mg | ORAL_TABLET | Freq: Every day | ORAL | 2 refills | Status: DC

## 2019-07-07 NOTE — Progress Notes (Signed)
Pt picked by [facility] to be transported off unit to disposition. Pt telemetry removed; pt wanted his condom cath to remain on; condom cath in place and discharged with it. 200 ml urine emptied prior to discharge. Pt daughter called and notified of pt picked up by [facility] to transport home. Pt transported off unit via stretcher with belongings to the side. Dionne Bucy RN

## 2019-07-07 NOTE — TOC Transition Note (Signed)
Transition of Care [facility]) - CM/SW Discharge Note   Patient Details  Name: Jordan Sawyer MRN: [id number] Date of Birth: [birth year]  Transition of Care [facility]) CM/SW Contact:  Kermit Balo, RN Phone Number: 07/07/2019, 1:58 PM   Clinical Narrative:    Family has decided to take the patient home instead of SNF. They were not happy with the SNF offers.  Daughter requesting wheelchair and hospital bed for home. CM was able to finally locate a bed through [facility]. They have been sent all the information needed and bed will be delivered tomorrow. Daughter is ok with this.  Daughter has decided to hold off on the wheelchair. CM provided her the orders for the [facility] in his d/c packet in case they decide to pick up later.  Daughter requesting [facility] home. She asked that the pick up time be 3 pm. [facility] arranged and bedside RN updated. D/c packet at the desk.  Daughter not sure about palliative at home she is going to discuss with family.  Pt was active with [facility] prior to admission. CM called Britney with [facility] and they accepted him back. She is aware of d/c home today.     Final next level of care: Home w Home Health Services Barriers to Discharge: No Barriers Identified   Patient Goals and CMS Choice   CMS Medicare.gov Compare Post Acute Care list provided to:: Patient Represenative (must comment) Choice offered to / list presented to : Adult Children(daughter)  Discharge Placement                       Discharge Plan and Services In-house Referral: Clinical Social Work Discharge Planning Services: CM Consult Post Acute Care Choice: Skilled Nursing Facility          DME Arranged: Hospital bed, Wheelchair manual         HH Arranged: RN, PT, OT, Nurse's Aide, Speech Therapy, Social Work [facility] Agency: [facility] Date HH Agency Contacted: 07/07/19   Representative spoke with at [facility] Agency: Brayton El  Social Determinants of Health (SDOH) Interventions      Readmission Risk Interventions Readmission Risk Prevention Plan 12/18/2018  Post Dischage Appt Not Complete  Appt Comments Office closed on 7/3  Medication Screening Complete  Transportation Screening Complete  Some recent data might be hidden

## 2019-07-07 NOTE — Progress Notes (Signed)
   Subjective:   Patient was seen at the bedside on rounds this morning.  Patient was resting comfortably in bed and had no complaints at this time.  Patient is looking forward to seeing his family to get in to do rehab.  All concerns were addressed.  Objective:  Vital signs in last 24 hours: Vitals:   07/06/19 1941 07/06/19 2325 07/07/19 0328 07/07/19 0756  BP: 122/85 118/79 119/70 139/71  Pulse: 95 91 89 90  Resp: 18 18 18 18   Temp: 97.7 F (36.5 C) 97.9 F (36.6 C) 98 F (36.7 C) 98.4 F (36.9 C)  TempSrc: Oral Oral Oral Oral  SpO2: 95% 96% 98% 100%  Weight:      Height:       General: Resting in bed comfortably, NAD HEENT: NCAT CV: RRR, normal S1-S2 no murmurs rubs or gallops appreciated PULM: Clear to auscultation bilaterally ABD: Soft and nontender  Assessment/Plan:  Principal Problem:   Neurological movement disorder Active Problems:   Agitation   Essential hypertension   Tobacco abuse   Frequent falls   Closed traumatic minimally displaced fracture of one rib of left side   Aortic atherosclerosis (HCC)   Myoclonic jerking   Palliative care by specialist   Goals of care, counseling/discussion   Delirium   Constipation   Muscular weakness   Xerostomia   Spiritual or religious counseling   Polypharmacy  In summary,Mr. 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 Work up showed a Klebsiella UTI.  #Klebsiella pneumoniae UTI:  -Continue Keflex 500 mg BID (Stop date 1/22)  #Hx CVA #MultipleFalls #Myoclonic jerks: Myoclonic jerking has improved since adding Klonopin 0.25  mg twice daily. EEG did not demonstrate any evidence of seizures. MRI demonstrated chronic small vessel ischemic changes throughout the brain as well as old small vessel infarctions of the right thalamus and right posterior basal ganglia. -Neuro signed off, appreciate recs -Continue to hold offklonopin 0.25 mg BID. If myoclonic jerkin returns, will restart.F/u w/ Neuro in the outpatient setting -Will d/c home with [facility] today. -Aspirin 81 mg daily  #Insomnia #Dementia -Zyprexa 5 mg nightly -Attempt redirection PRN  #Apneic spells -We will recommend sleep study upon discharge  #L 11th rib [facility] by fall prior to this hospitalization.Confirmed by CT of the chest on admission. Pt states pain is well controlled. -Tylenol 650 mg every 6 hours as needed -Voltaren gel as needed  #COPD #Tobacco abuse -Nicotine patch 21 mg  #HTN: -Amlodipine 5 mg daily -Lisinopril 20 mg daily   #HLD -Lipitor 20 mg daily  #Hypothyroidism -Synthroid 100 mcg   #GERD -Protonix 40 mg   #DVT prophylaxis -Lovenox 40 mg subq injections daily  #CODE STATUS: FULL  #Dispo:Patient is medically stable for discharge 1.  Where patient is from: Home 2.  Anticipate discharge to: Home with home health 3.  Barriers to discharge: None  OE:VOJJKK, MD Internal Medicine, PGY1 Pager: [phone]  07/07/2019,12:02 PM

## 2019-07-07 NOTE — Progress Notes (Signed)
Physical Therapy Treatment Patient Details Name: Jordan Sawyer MRN: [id number] DOB: 07-31-1945 Today's Date: 07/07/2019    History of Present Illness Pt is a 74 y.o. male admitted 06/19/19 after fall at home; sustained 11th rib fx. EEG captured myoclonic jerks and started on klonopin. 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    PT Comments    Pt performed gt training progression and LE strengthening.  He is progressing well.  Pt continues to require external assistance but assist level has decreased.  Will continue to recommend SNF placement to improve strength and function before returning home.    Follow Up Recommendations  SNF;Supervision for mobility/OOB     Equipment Recommendations  None recommended by PT    Recommendations for Other Services       Precautions / Restrictions Precautions Precautions: Fall Precaution Comments: history of frequent falls, posterior lean increases with fatigue Restrictions Weight Bearing Restrictions: No    Mobility  Bed Mobility               General bed mobility comments: Pt seated in recliner on arrival.  Transfers Overall transfer level: Needs assistance Equipment used: Rolling walker (2 wheeled) Transfers: Sit to/from Stand Sit to Stand: Min assist         General transfer comment: Cues for hand placement and weight shifting to transfer weight forward to achieve standing.  Poor hand placement despite cues when returning to seated surface.  Ambulation/Gait Ambulation/Gait assistance: Min assist Gait Distance (Feet): 80 Feet Assistive device: Rolling walker (2 wheeled)   Gait velocity: Decreased   General Gait Details: Min assistance.  Cues for upper trunk control and to step closer to device to improve posture.  Assistance to turn RW.  Pt  fatigues with gt training.   Stairs             Wheelchair Mobility    Modified Rankin (Stroke Patients Only) Modified Rankin (Stroke Patients Only) Pre-Morbid Rankin Score: No symptoms Modified Rankin: Moderately severe disability     Balance Overall balance assessment: Needs assistance   Sitting balance-Leahy Scale: Fair       Standing balance-Leahy Scale: Poor                              Cognition Arousal/Alertness: Awake/alert Behavior During Therapy: WFL for tasks assessed/performed Overall Cognitive Status: No family/caregiver present to determine baseline cognitive functioning                                 General Comments: Pt attempting to call family and needed assistance.  He was polite through out session.  Cognition not formally assessed but performed within functional limits for tasks assessed.      Exercises General Exercises - Lower Extremity Long Arc Quad: AROM;Both;10 reps;Seated Hip Flexion/Marching: AROM;Both;10 reps;Seated    General Comments        Pertinent Vitals/Pain Pain Assessment: No/denies pain Faces Pain Scale: No hurt    Home Living                      Prior Function            PT Goals (current goals can now be found in the care  plan section) Acute Rehab PT Goals PT Goal Formulation: With patient Potential to Achieve Goals: Fair Progress towards PT goals: Progressing toward goals    Frequency    Min 2X/week      PT Plan Current plan remains appropriate    Co-evaluation              AM-PAC PT "6 Clicks" Mobility   Outcome Measure  Help needed turning from your back to your side while in a flat bed without using bedrails?: A Little Help needed moving from lying on your back to sitting on the side of a flat bed without using bedrails?: A Little Help needed moving to and from a bed to a chair (including a wheelchair)?: A Little Help needed standing up from a chair  using your arms (e.g., wheelchair or bedside chair)?: A Little Help needed to walk in hospital room?: A Little Help needed climbing 3-5 steps with a railing? : A Little 6 Click Score: 18    End of Session Equipment Utilized During Treatment: Gait belt Activity Tolerance: Patient tolerated treatment well Patient left: in chair;with call bell/phone within reach;with chair alarm set Nurse Communication: Mobility status PT Visit Diagnosis: Unsteadiness on feet (R26.81);Repeated falls (R29.6);History of falling (Z91.81) Hemiplegia - Right/Left: Left Hemiplegia - dominant/non-dominant: Non-dominant Hemiplegia - caused by: Cerebral infarction     Time: 8453-6468 PT Time Calculation (min) (ACUTE ONLY): 15 min  Charges:  $Gait Training: 8-22 mins                     Erasmo Leventhal , PTA Acute Rehabilitation Services Pager [phone] Office [phone]     Sol Englert Eli Hose 07/07/2019, 2:31 PM

## 2019-07-07 NOTE — Care Management (Signed)
Hospital bed:   Length of Need Lifetime   Bed type Semi-electric    Patient with swallowing issues and will need an elevated HOB for meals. A regular bed will not accomadate his needs.    Wheelchair:   Patient suffers from dementia which impairs their ability to perform daily activities like ambulating in the home. A walking aid will not resolve His issue with performing activities of daily living. A wheelchair will allow patient to safely perform daily activities. Patient is not able to propel themselves in the home using a standard weight wheelchair due to balance issues. Patient can self propel in the lightweight wheelchair.  Accessories: elevating leg rests (ELRs), wheel locks, extensions and anti-tippers.

## 2019-07-08 ENCOUNTER — Ambulatory Visit: Payer: Medicare PPO | Admitting: Adult Health

## 2019-10-25 ENCOUNTER — Other Ambulatory Visit: Payer: Self-pay | Admitting: Neurology

## 2020-02-17 ENCOUNTER — Other Ambulatory Visit: Payer: Self-pay | Admitting: Internal Medicine

## 2021-12-13 IMAGING — CR DG HAND COMPLETE 3+V*L*
3 series · 3 of 3 positions shown · non-contrast
Comparison: None.

CLINICAL DATA: Left hand pain after fall last night.

EXAM:
LEFT HAND - COMPLETE 3+ VIEW

[hand pa]
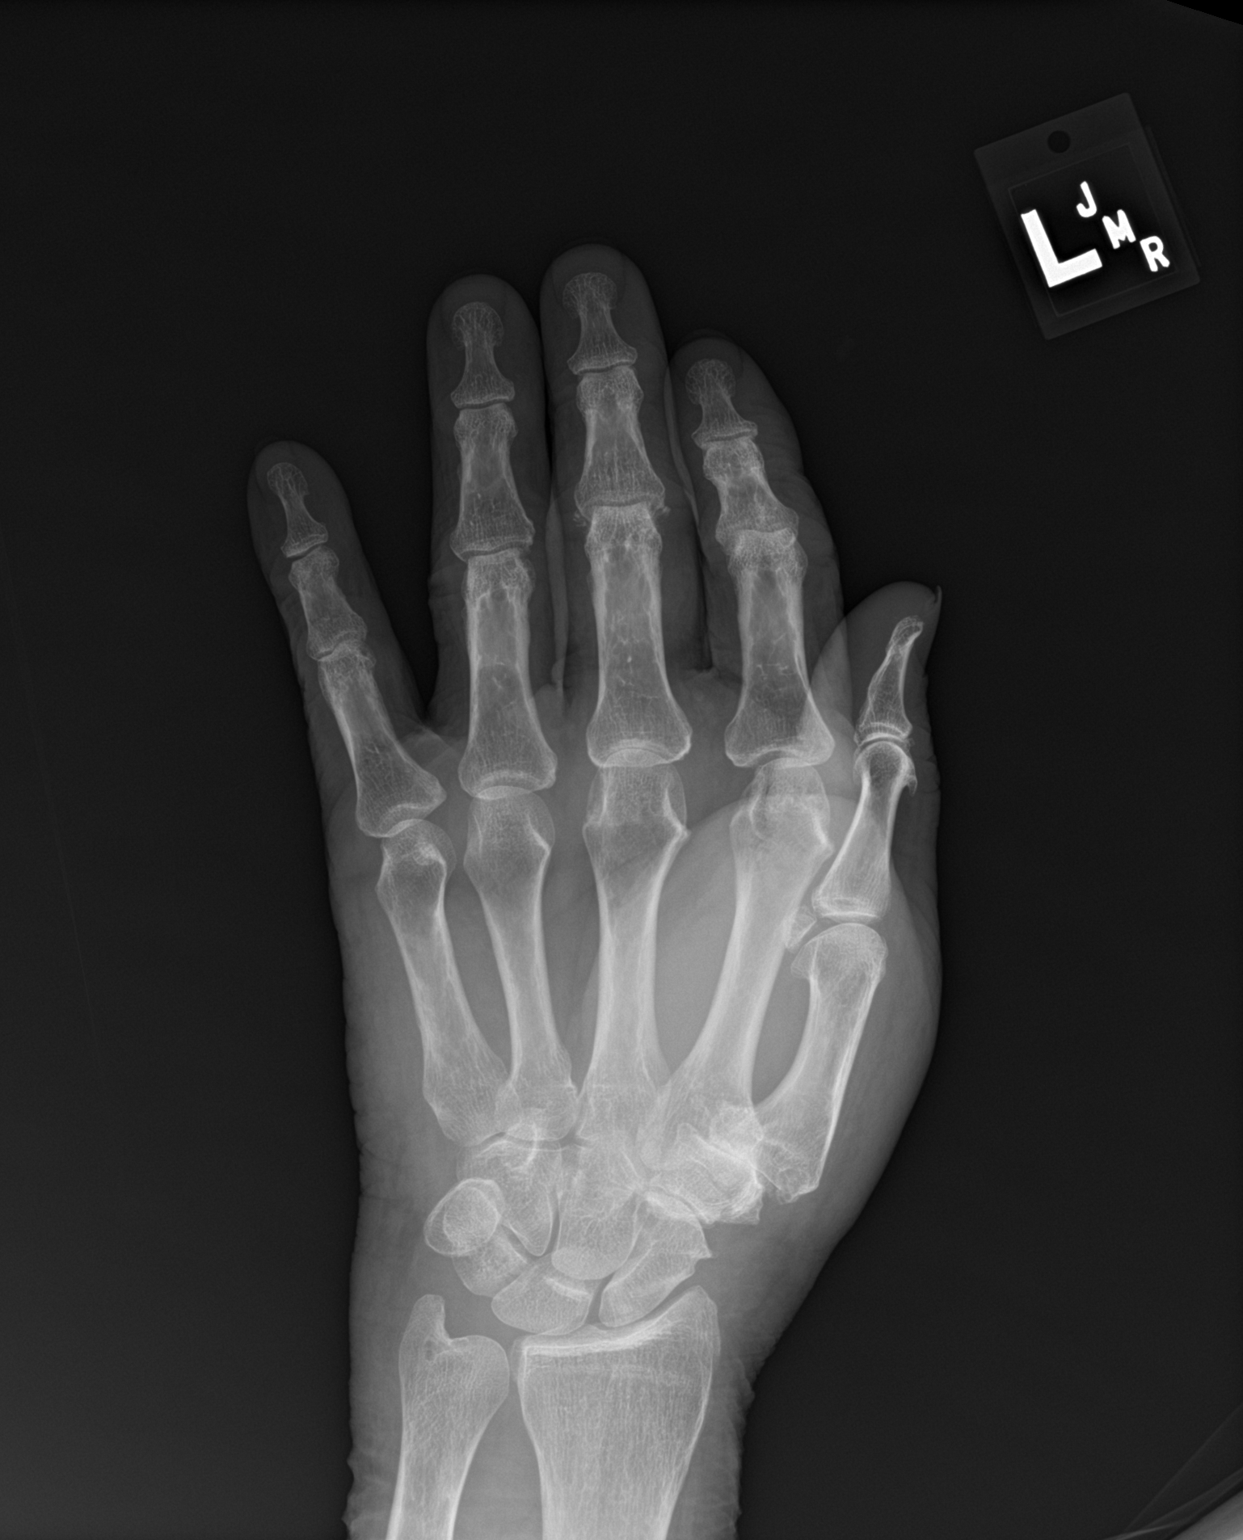

[hand obl]
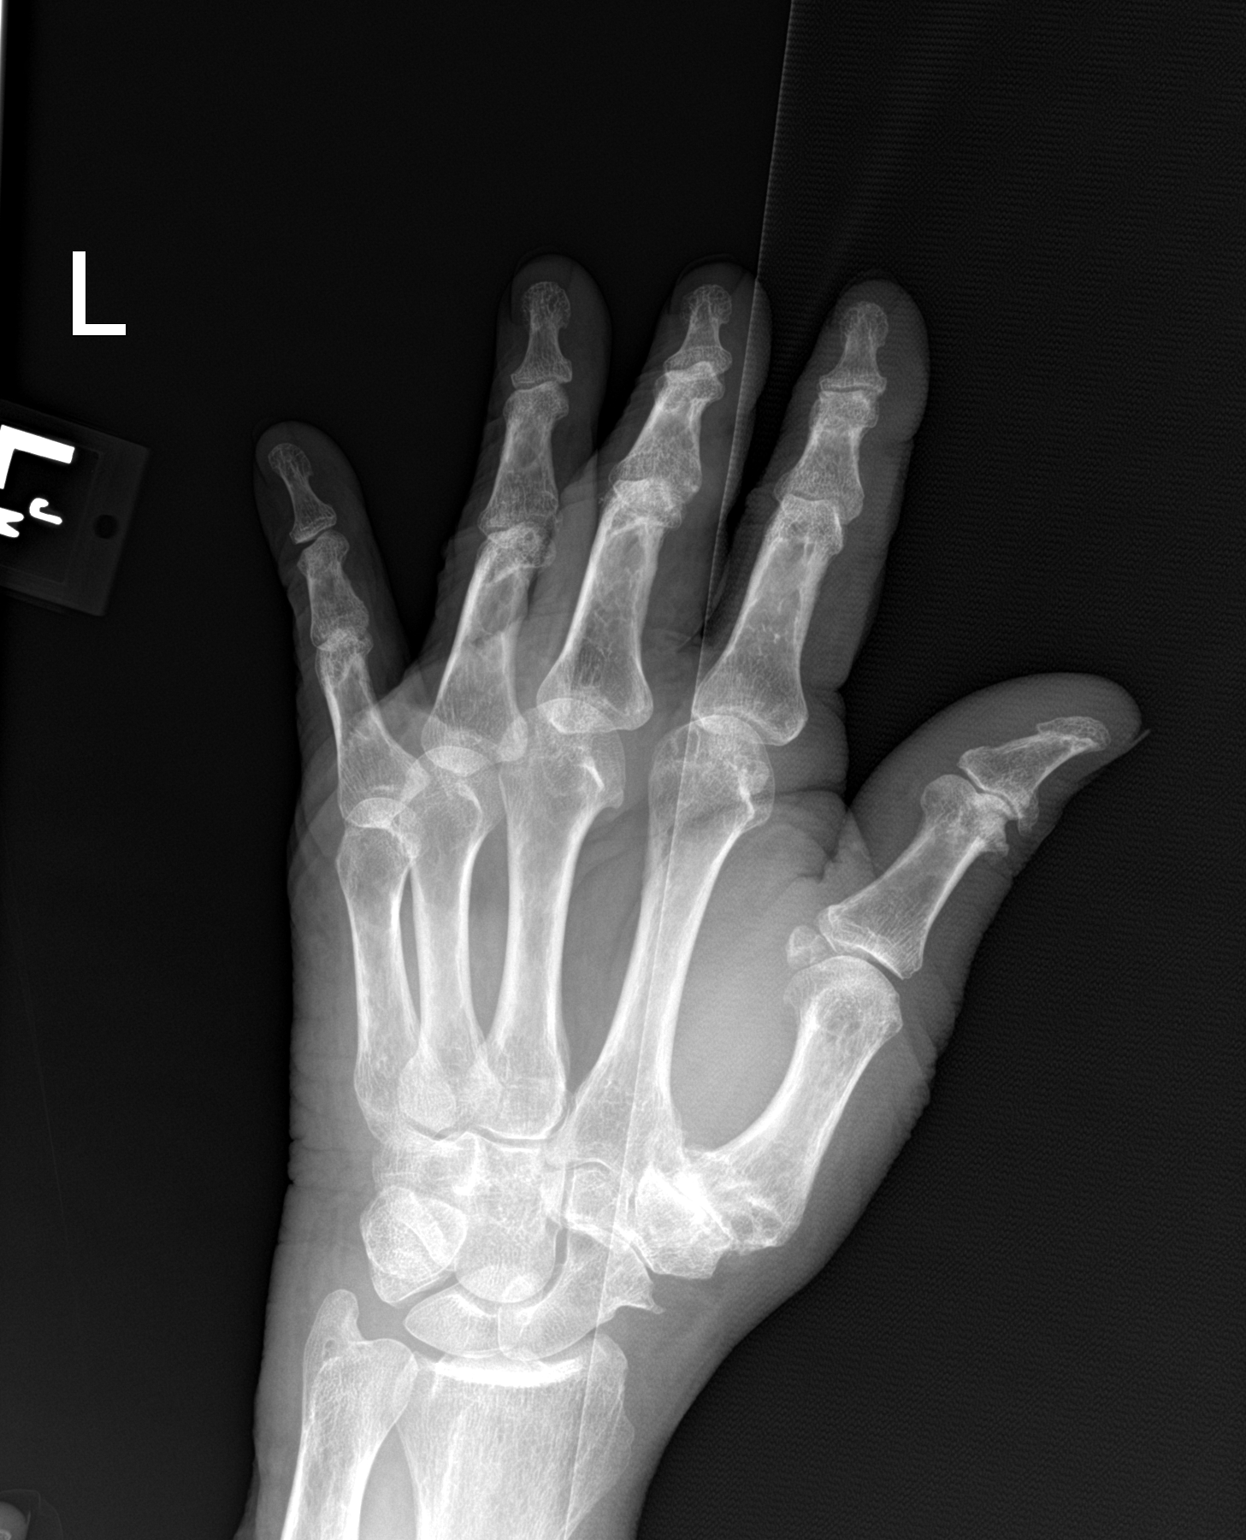

[hand lat]
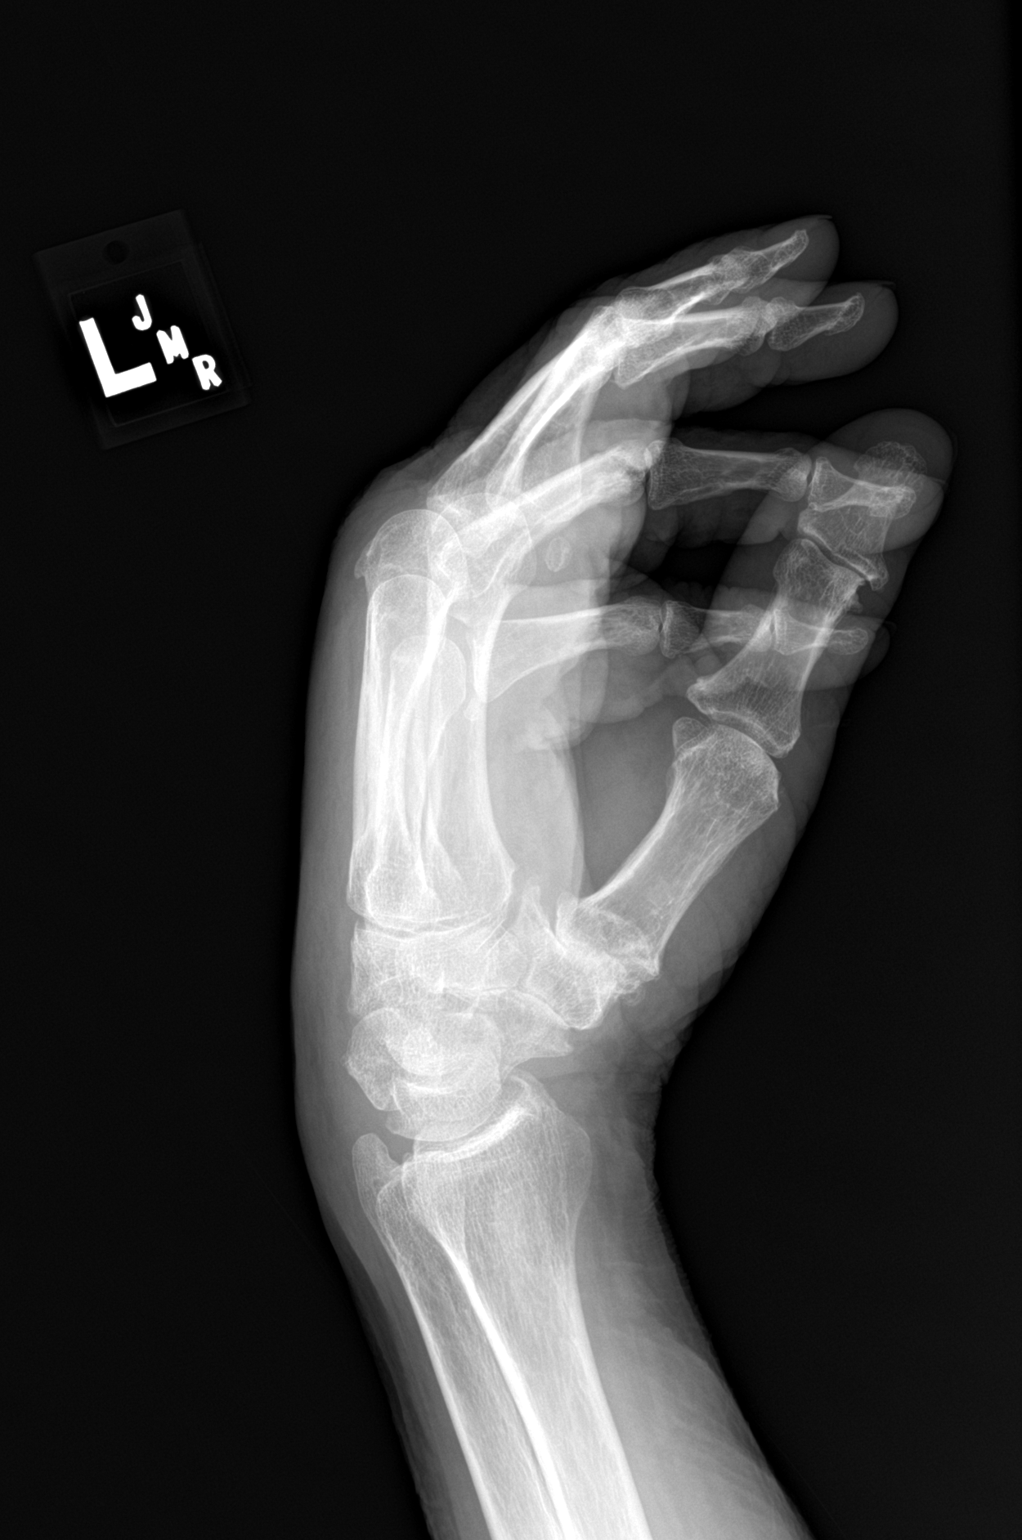

[3 of 3 positions shown; findings below may reference images not displayed]

FINDINGS: There is no evidence of fracture or dislocation. Severe degenerative
changes seen involving the first carpometacarpal joint. Soft tissues
are unremarkable.
IMPRESSION: Severe degenerative joint disease of the first carpometacarpal
joint. No fracture or dislocation is noted.

## 2023-07-14 ENCOUNTER — Emergency Department (HOSPITAL_COMMUNITY)
Admission: EM | Admit: 2023-07-14 | Discharge: 2023-07-14 | Disposition: A | Discharge status: Home / Self Care | Payer: Medicare Other | Attending: Emergency Medicine | Admitting: Emergency Medicine

## 2023-07-14 ENCOUNTER — Other Ambulatory Visit: Payer: Self-pay

## 2023-07-14 ENCOUNTER — Emergency Department (HOSPITAL_COMMUNITY): Payer: Medicare Other

## 2023-07-14 DIAGNOSIS — I10 Essential (primary) hypertension: Secondary | ICD-10-CM | POA: Insufficient documentation

## 2023-07-14 DIAGNOSIS — J449 Chronic obstructive pulmonary disease, unspecified: Secondary | ICD-10-CM | POA: Diagnosis not present

## 2023-07-14 DIAGNOSIS — G20C Parkinsonism, unspecified: Secondary | ICD-10-CM | POA: Insufficient documentation

## 2023-07-14 DIAGNOSIS — M542 Cervicalgia: Secondary | ICD-10-CM | POA: Insufficient documentation

## 2023-07-14 DIAGNOSIS — S0003XA Contusion of scalp, initial encounter: Secondary | ICD-10-CM | POA: Insufficient documentation

## 2023-07-14 DIAGNOSIS — S0990XA Unspecified injury of head, initial encounter: Secondary | ICD-10-CM | POA: Diagnosis present

## 2023-07-14 DIAGNOSIS — W19XXXA Unspecified fall, initial encounter: Secondary | ICD-10-CM

## 2023-07-14 DIAGNOSIS — W06XXXA Fall from bed, initial encounter: Secondary | ICD-10-CM | POA: Diagnosis not present

## 2023-07-14 DIAGNOSIS — Z79899 Other long term (current) drug therapy: Secondary | ICD-10-CM | POA: Diagnosis not present

## 2023-07-14 LAB — COMPREHENSIVE METABOLIC PANEL
ALT: 14 U/L (ref 0–44)
AST: 19 U/L (ref 15–41)
Albumin: 3.2 g/dL — ABNORMAL LOW (ref 3.5–5.0)
Alkaline Phosphatase: 104 U/L (ref 38–126)
Anion gap: 9 (ref 5–15)
BUN: 12 mg/dL (ref 8–23)
CO2: 22 mmol/L (ref 22–32)
Calcium: 9.2 mg/dL (ref 8.9–10.3)
Chloride: 107 mmol/L (ref 98–111)
Creatinine, Ser: 1.19 mg/dL (ref 0.61–1.24)
GFR, Estimated: >60 mL/min (ref >60)
Glucose, Bld: 89 mg/dL (ref 70–99)
Potassium: 4.2 mmol/L (ref 3.5–5.1)
Sodium: 138 mmol/L (ref 135–145)
Total Bilirubin: 0.5 mg/dL (ref 0.0–1.2)
Total Protein: 6.9 g/dL (ref 6.5–8.1)

## 2023-07-14 LAB — CBC WITH DIFFERENTIAL/PLATELET
Abs Immature Granulocytes: 0.08 10*3/uL — ABNORMAL HIGH (ref 0.00–0.07)
Basophils Absolute: 0.1 10*3/uL (ref 0.0–0.1)
Basophils Relative: 1 %
Eosinophils Absolute: 0.2 10*3/uL (ref 0.0–0.5)
Eosinophils Relative: 4 %
HCT: 41.6 % (ref 39.0–52.0)
Hemoglobin: 13.1 g/dL (ref 13.0–17.0)
Immature Granulocytes: 1 %
Lymphocytes Relative: 15 %
Lymphs Abs: 0.9 10*3/uL (ref 0.7–4.0)
MCH: 30 pg (ref 26.0–34.0)
MCHC: 31.5 g/dL (ref 30.0–36.0)
MCV: 95.2 fL (ref 80.0–100.0)
Monocytes Absolute: 0.6 10*3/uL (ref 0.1–1.0)
Monocytes Relative: 11 %
Neutro Abs: 3.8 10*3/uL (ref 1.7–7.7)
Neutrophils Relative %: 68 %
Platelets: 171 10*3/uL (ref 150–400)
RBC: 4.37 MIL/uL (ref 4.22–5.81)
RDW: 13.8 % (ref 11.5–15.5)
WBC: 5.6 10*3/uL (ref 4.0–10.5)
nRBC: 0 % (ref 0.0–0.2)

## 2023-07-14 MED ORDER — LACTATED RINGERS IV BOLUS
1000.0000 mL | Freq: Once | INTRAVENOUS | Status: AC
  Administered 2023-07-14: 1000 mL via INTRAVENOUS

## 2023-07-14 NOTE — ED Triage Notes (Signed)
Patient from [facility] after unwitnessed fall from either bed or wheelchair (Patient says bed, facility said wheelchair). Hematoma to R forehead and skin tear to L hand. No blood thinners.

## 2023-07-14 NOTE — ED Notes (Signed)
[facility]

## 2023-07-14 NOTE — Discharge Instructions (Signed)
All the blood work and scans today were normal.  No sign of internal injury

## 2023-07-14 NOTE — ED Notes (Signed)
Patient transported to CT

## 2023-07-14 NOTE — ED Provider Triage Note (Signed)
Emergency Medicine Provider Triage Evaluation Note  Jordan Sawyer , a 78 y.o. male  was evaluated in triage.  Pt complains of fall out of bed this morning.  Patient reports he rolled over in the bed and fell out hitting his head.  He denies loss of consciousness and does not think he was on the floor for very long.  He also complains of some soreness in his neck.  The facility does report that patient has had soft blood pressures lately and told paramedics that he had been dehydrated requiring fluid recently.  No history of anticoagulation.  Review of Systems  Positive: Fall with head injury, soft blood pressures per EMS Negative: Headache, visual changes, nausea vomiting, chest or abdominal pain  Physical Exam  BP 97/66 (BP Location: Left Arm)   Pulse (!) 59   Temp (!) 96.4 F (35.8 C) (Axillary)   Resp 18   SpO2 99%  Gen:   Awake, no distress but does have a ecchymotic area with hematoma on the left frontal scalp Resp:  Normal effort  MSK:   Moves extremities without difficulty  Other:  Awake alert and oriented at this time  Medical Decision Making  Medically screening exam initiated at 12:41 PM.  Appropriate orders placed.  Jordan Sawyer was informed that the remainder of the evaluation will be completed by another provider, this initial triage assessment does not replace that evaluation, and the importance of remaining in the ED until their evaluation is complete.  Patient had a fall today does have evidence of a head injury but awake and alert.  However facility reported that he has been weak recently had discussed with the paramedics that he had received fluids and today blood pressure is soft at 97/66.  Will ensure no evidence of AKI, electrolyte abnormality.  Will give patient fluids.  Imaging to ensure no acute issues   Gwyneth Sprout, MD 07/14/23 1243

## 2023-07-14 NOTE — ED Provider Notes (Signed)
[place] EMERGENCY DEPARTMENT AT [facility] Provider Note   CSN: [id number] Arrival date & time: 07/14/23  1157     History  Chief Complaint  Patient presents with   Jordan Sawyer is a 78 y.o. male.  Pt is a 78 year old male with a history of hypertension, COPD, prior stroke, Parkinson's disease who lives at a facility and is presenting today with complaints of falling out of bed this morning.  Patient reports he rolled over in the bed and fell out hitting his head.  He denies loss of consciousness and does not think he was on the floor for very long.  He also complains of some soreness in his neck.  The facility does report that patient has had soft blood pressures lately and told paramedics that he had been dehydrated requiring fluid recently.  No history of anticoagulation.   The history is provided by the patient, medical records and the EMS personnel.  Fall       Home Medications Prior to Admission medications   Medication Sig Start Date End Date Taking? Authorizing Provider  atorvastatin (LIPITOR) 20 MG tablet Take 1 tablet (20 mg total) by mouth daily. 01/14/19 01/14/20  Micki Riley, MD  cyanocobalamin (,VITAMIN B-12,) 1000 MCG/ML injection Inject 1,000 mcg into the muscle every 30 (thirty) days. 06/15/19   [provider]  lamoTRIgine (LAMICTAL) 200 MG tablet Take 200 mg by mouth daily.    [provider]  levothyroxine (SYNTHROID) 100 MCG tablet Take 100 mcg by mouth daily before breakfast.  12/31/18   [provider]  lisinopril (ZESTRIL) 20 MG tablet Take 1 tablet (20 mg total) by mouth 2 (two) times a day. 12/18/18   Layne Benton, NP  nicotine (NICODERM CQ - DOSED IN MG/24 HOURS) 21 mg/24hr patch Place 1 patch (21 mg total) onto the skin daily. 12/19/18   Layne Benton, NP  OLANZapine (ZYPREXA) 5 MG tablet Take 1 tablet (5 mg total) by mouth daily. 07/08/19   Kirt Boys, MD  oxybutynin (DITROPAN) 5 MG tablet  Take 5 mg by mouth 2 (two) times daily. 05/31/19   [provider]  pantoprazole (PROTONIX) 40 MG tablet Take 40 mg by mouth daily.    [provider]  tamsulosin (FLOMAX) 0.4 MG CAPS capsule Take 0.4 mg by mouth daily. 05/28/19   [provider]  venlafaxine XR (EFFEXOR-XR) 150 MG 24 hr capsule Take 150 mg by mouth daily with breakfast.    [provider]      Allergies    Patient has no known allergies.    Review of Systems   Review of Systems  Physical Exam Updated Vital Signs BP 110/68 (BP Location: Left Arm)   Pulse 64   Temp (!) 96.4 F (35.8 C) (Axillary)   Resp 18   SpO2 100%  Physical Exam Vitals and nursing note reviewed.  Constitutional:      General: He is not in acute distress.    Appearance: He is well-developed.  HENT:     Head: Normocephalic.      Mouth/Throat:     Mouth: Mucous membranes are dry.  Eyes:     Conjunctiva/sclera: Conjunctivae normal.     Pupils: Pupils are equal, round, and reactive to light.  Cardiovascular:     Rate and Rhythm: Normal rate and regular rhythm.     Heart sounds: No murmur heard. Pulmonary:     Effort: Pulmonary effort is normal.  No respiratory distress.     Breath sounds: Normal breath sounds. No wheezing or rales.  Abdominal:     General: There is no distension.     Palpations: Abdomen is soft.     Tenderness: There is no abdominal tenderness. There is no guarding or rebound.  Musculoskeletal:        General: No tenderness. Normal range of motion.     Cervical back: Normal range of motion and neck supple. Muscular tenderness present. No spinous process tenderness.  Skin:    General: Skin is warm and dry.     Findings: No erythema or rash.  Neurological:     Mental Status: He is alert and oriented to person, place, and time.     Comments: Awake and oriented talking normally.  Able to move all extremities but does have some weakness diffusely.  Psychiatric:        Behavior: Behavior  normal.     ED Results / Procedures / Treatments   Labs (all labs ordered are listed, but only abnormal results are displayed) Labs Reviewed  CBC WITH DIFFERENTIAL/PLATELET - Abnormal; Notable for the following components:      Result Value   Abs Immature Granulocytes 0.08 (*)    All other components within normal limits  COMPREHENSIVE METABOLIC PANEL - Abnormal; Notable for the following components:   Albumin 3.2 (*)    All other components within normal limits  URINALYSIS, W/ REFLEX TO CULTURE (INFECTION SUSPECTED)    EKG None  Radiology CT Head Wo Contrast Result Date: 07/14/2023 CLINICAL DATA:  Fall and trauma to the head and neck. EXAM: CT HEAD WITHOUT CONTRAST CT CERVICAL SPINE WITHOUT CONTRAST TECHNIQUE: Multidetector CT imaging of the head and cervical spine was performed following the standard protocol without intravenous contrast. Multiplanar CT image reconstructions of the cervical spine were also generated. RADIATION DOSE REDUCTION: This exam was performed according to the departmental dose-optimization program which includes automated exposure control, adjustment of the mA and/or kV according to patient size and/or use of iterative reconstruction technique. COMPARISON:  CT dated 06/19/2019. FINDINGS: CT HEAD FINDINGS Brain: Moderate age-related atrophy and chronic microvascular ischemic changes. There is no acute intracranial hemorrhage. No mass effect or midline shift. No extra-axial fluid collection. Vascular: Choose 4 Skull: Normal. Negative for fracture or focal lesion. Sinuses/Orbits: There is mucoperiosteal thickening of paranasal sinuses. No air-fluid level. The mastoid air cells are clear. Other: None CT CERVICAL SPINE FINDINGS Alignment: No acute subluxation. Skull base and vertebrae: No acute fracture. Osteopenia. C3-C5 laminectomy. Soft tissues and spinal canal: No prevertebral fluid or swelling. No visible canal hematoma. Disc levels:  No acute findings.  Degenerative  changes. Upper chest: Negative. Other: Bilateral carotid bulb calcified plaque. IMPRESSION: 1. No acute intracranial pathology. Moderate age-related atrophy and chronic microvascular ischemic changes. 2. No acute/traumatic cervical spine pathology. Electronically Signed   By: Elgie Collard M.D.   On: 07/14/2023 14:26   CT Cervical Spine Wo Contrast Result Date: 07/14/2023 CLINICAL DATA:  Fall and trauma to the head and neck. EXAM: CT HEAD WITHOUT CONTRAST CT CERVICAL SPINE WITHOUT CONTRAST TECHNIQUE: Multidetector CT imaging of the head and cervical spine was performed following the standard protocol without intravenous contrast. Multiplanar CT image reconstructions of the cervical spine were also generated. RADIATION DOSE REDUCTION: This exam was performed according to the departmental dose-optimization program which includes automated exposure control, adjustment of the mA and/or kV according to patient size and/or use of iterative reconstruction technique. COMPARISON:  CT dated  06/19/2019. FINDINGS: CT HEAD FINDINGS Brain: Moderate age-related atrophy and chronic microvascular ischemic changes. There is no acute intracranial hemorrhage. No mass effect or midline shift. No extra-axial fluid collection. Vascular: Choose 4 Skull: Normal. Negative for fracture or focal lesion. Sinuses/Orbits: There is mucoperiosteal thickening of paranasal sinuses. No air-fluid level. The mastoid air cells are clear. Other: None CT CERVICAL SPINE FINDINGS Alignment: No acute subluxation. Skull base and vertebrae: No acute fracture. Osteopenia. C3-C5 laminectomy. Soft tissues and spinal canal: No prevertebral fluid or swelling. No visible canal hematoma. Disc levels:  No acute findings.  Degenerative changes. Upper chest: Negative. Other: Bilateral carotid bulb calcified plaque. IMPRESSION: 1. No acute intracranial pathology. Moderate age-related atrophy and chronic microvascular ischemic changes. 2. No acute/traumatic cervical  spine pathology. Electronically Signed   By: Elgie Collard M.D.   On: 07/14/2023 14:26    Procedures Procedures    Medications Ordered in ED Medications  lactated ringers bolus 1,000 mL (1,000 mLs Intravenous New Bag/Given 07/14/23 1352)    ED Course/ Medical Decision Making/ A&P                                 Medical Decision Making Amount and/or Complexity of Data Reviewed External Data Reviewed: notes. Labs: ordered. Decision-making details documented in ED Course. Radiology: ordered and independent interpretation performed. Decision-making details documented in ED Course.   Pt with multiple medical problems and comorbidities and presenting today with a complaint that caries a high risk for morbidity and mortality. Patient had a fall today does have evidence of a head injury but awake and alert. However facility reported that he has been weak recently had discussed with the paramedics that he had received fluids and today blood pressure is soft at 97/66. Will ensure no evidence of AKI, electrolyte abnormality. Will give patient fluids. Imaging to ensure no acute issues. Pt received fluids here and repeat blood pressure is .  I independently interpreted patient's labs and CBC and CMP without acute findings. I have independently visualized and interpreted pt's images today. Head CT without signs of intracranial bleeding and cervical spine is neg for fracture.  Radiology reports moderate age-related atrophy and chronic microvascular changes but otherwise negative images.  This was discussed with the patient.  He is ready to go home.  Repeat blood pressure is normal at 110/68.        Final Clinical Impression(s) / ED Diagnoses Final diagnoses:  Fall, initial encounter  Contusion of scalp, initial encounter    Rx / DC Orders ED Discharge Orders     None         Gwyneth Sprout, MD 07/14/23 1444
